# Patient Record
Sex: Male | Born: 1960 | Race: Black or African American | Hispanic: No | Marital: Married | Smoking: Former smoker
Health system: Southern US, Community
[De-identification: ages and names within clinical notes are randomized; demographics above are authoritative.]

## PROBLEM LIST (undated history)

## (undated) DIAGNOSIS — I10 Essential (primary) hypertension: Secondary | ICD-10-CM

## (undated) DIAGNOSIS — I509 Heart failure, unspecified: Secondary | ICD-10-CM

## (undated) DIAGNOSIS — D649 Anemia, unspecified: Secondary | ICD-10-CM

## (undated) DIAGNOSIS — K5730 Diverticulosis of large intestine without perforation or abscess without bleeding: Secondary | ICD-10-CM

## (undated) DIAGNOSIS — I2510 Atherosclerotic heart disease of native coronary artery without angina pectoris: Secondary | ICD-10-CM

## (undated) DIAGNOSIS — E785 Hyperlipidemia, unspecified: Secondary | ICD-10-CM

## (undated) HISTORY — PX: ANGIOPLASTY: SHX39

## (undated) HISTORY — DX: Hyperlipidemia, unspecified: E78.5

## (undated) HISTORY — PX: LIPOMA EXCISION: SHX5283

## (undated) HISTORY — DX: Diverticulosis of large intestine without perforation or abscess without bleeding: K57.30

## (undated) HISTORY — DX: Anemia, unspecified: D64.9

---

## 2000-09-04 ENCOUNTER — Emergency Department (HOSPITAL_COMMUNITY): Admission: EM | Admit: 2000-09-04 | Discharge: 2000-09-04 | Payer: Self-pay | Admitting: Emergency Medicine

## 2001-01-15 ENCOUNTER — Encounter: Payer: Self-pay | Admitting: Emergency Medicine

## 2001-01-15 ENCOUNTER — Emergency Department (HOSPITAL_COMMUNITY): Admission: EM | Admit: 2001-01-15 | Discharge: 2001-01-15 | Payer: Self-pay | Admitting: Emergency Medicine

## 2001-02-13 ENCOUNTER — Inpatient Hospital Stay (HOSPITAL_COMMUNITY): Admission: EM | Admit: 2001-02-13 | Discharge: 2001-02-18 | Payer: Self-pay | Admitting: Emergency Medicine

## 2001-02-13 ENCOUNTER — Encounter: Payer: Self-pay | Admitting: Emergency Medicine

## 2001-02-17 ENCOUNTER — Encounter: Payer: Self-pay | Admitting: Nephrology

## 2004-06-21 ENCOUNTER — Emergency Department (HOSPITAL_COMMUNITY): Admission: EM | Admit: 2004-06-21 | Discharge: 2004-06-22 | Payer: Self-pay | Admitting: Emergency Medicine

## 2010-03-22 ENCOUNTER — Encounter (INDEPENDENT_AMBULATORY_CARE_PROVIDER_SITE_OTHER): Payer: Self-pay | Admitting: *Deleted

## 2010-03-22 ENCOUNTER — Encounter: Admission: RE | Admit: 2010-03-22 | Discharge: 2010-03-22 | Payer: Self-pay | Admitting: Family Medicine

## 2010-05-02 ENCOUNTER — Encounter (INDEPENDENT_AMBULATORY_CARE_PROVIDER_SITE_OTHER): Payer: Self-pay | Admitting: *Deleted

## 2010-05-30 ENCOUNTER — Encounter (INDEPENDENT_AMBULATORY_CARE_PROVIDER_SITE_OTHER): Payer: Self-pay | Admitting: *Deleted

## 2010-05-30 ENCOUNTER — Ambulatory Visit: Payer: Self-pay | Admitting: Gastroenterology

## 2010-05-30 DIAGNOSIS — K5730 Diverticulosis of large intestine without perforation or abscess without bleeding: Secondary | ICD-10-CM | POA: Insufficient documentation

## 2010-06-14 ENCOUNTER — Ambulatory Visit: Payer: Self-pay | Admitting: Gastroenterology

## 2010-06-22 DIAGNOSIS — D509 Iron deficiency anemia, unspecified: Secondary | ICD-10-CM | POA: Insufficient documentation

## 2010-06-22 DIAGNOSIS — E785 Hyperlipidemia, unspecified: Secondary | ICD-10-CM | POA: Insufficient documentation

## 2010-06-22 DIAGNOSIS — I259 Chronic ischemic heart disease, unspecified: Secondary | ICD-10-CM | POA: Insufficient documentation

## 2010-06-22 DIAGNOSIS — I10 Essential (primary) hypertension: Secondary | ICD-10-CM | POA: Insufficient documentation

## 2010-06-26 ENCOUNTER — Ambulatory Visit: Payer: Self-pay | Admitting: Internal Medicine

## 2010-12-19 NOTE — Letter (Signed)
Summary: [facility] Instructions  [facility]  [address] [place], [place] [address]   Phone: [phone]  Fax: [phone]       Justin [name]    [birth year]    MRN: [id number]        Procedure Day /Date:06/14/10 WED     Arrival Time:3 pm     Procedure Time:4 pm     Location of Procedure:                    X  [facility] (4th Floor)   PREPARATION FOR COLONOSCOPY WITH MOVIPREP   Starting 5 days prior to your procedure 06/09/10 do not eat nuts, seeds, popcorn, corn, beans, peas,  salads, or any raw vegetables.  Do not take any fiber supplements (e.g. Metamucil, Citrucel, and Benefiber).  THE DAY BEFORE YOUR PROCEDURE         DATE: 06/13/10 DAY: TUE  1.  Drink clear liquids the entire day-NO SOLID FOOD  2.  Do not drink anything colored red or purple.  Avoid juices with pulp.  No orange juice.  3.  Drink at least 64 oz. (8 glasses) of fluid/clear liquids during the day to prevent dehydration and help the prep work efficiently.  CLEAR LIQUIDS INCLUDE: Water Jello Ice Popsicles Tea (sugar ok, no milk/cream) Powdered fruit flavored drinks Coffee (sugar ok, no milk/cream) Gatorade Juice: apple, white grape, white cranberry  Lemonade Clear bullion, consomm, broth Carbonated beverages (any kind) Strained chicken noodle soup Hard Candy                             4.  In the morning, mix first dose of MoviPrep solution:    Empty 1 Pouch A and 1 Pouch B into the disposable container    Add lukewarm drinking water to the top line of the container. Mix to dissolve    Refrigerate (mixed solution should be used within 24 hrs)  5.  Begin drinking the prep at 5:00 p.m. The MoviPrep container is divided by 4 marks.   Every 15 minutes drink the solution down to the next mark (approximately 8 oz) until the full liter is complete.   6.  Follow completed prep with 16 oz of clear liquid of your choice (Nothing red or purple).  Continue to drink clear  liquids until bedtime.  7.  Before going to bed, mix second dose of MoviPrep solution:    Empty 1 Pouch A and 1 Pouch B into the disposable container    Add lukewarm drinking water to the top line of the container. Mix to dissolve    Refrigerate  THE DAY OF YOUR PROCEDURE      DATE: 06/14/10 DAY:WED  Beginning at 11 a.m. (5 hours before procedure):         1. Every 15 minutes, drink the solution down to the next mark (approx 8 oz) until the full liter is complete.  2. Follow completed prep with 16 oz. of clear liquid of your choice.    3. You may drink clear liquids until 2 pm (2 HOURS BEFORE PROCEDURE).   MEDICATION INSTRUCTIONS  Unless otherwise instructed, you should take regular prescription medications with a small sip of water   as early as possible the morning of your procedure.        OTHER INSTRUCTIONS  You will need a responsible adult at least 52 years of age to accompany you  and drive you home.   This person must remain in the waiting room during your procedure.  Wear loose fitting clothing that is easily removed.  Leave jewelry and other valuables at home.  However, you may wish to bring a book to read or  an iPod/MP3 player to listen to music as you wait for your procedure to start.  Remove all body piercing jewelry and leave at home.  Total time from sign-in until discharge is approximately 2-3 hours.  You should go home directly after your procedure and rest.  You can resume normal activities the  day after your procedure.  The day of your procedure you should not:   Drive   Make legal decisions   Operate machinery   Drink alcohol   Return to work  You will receive specific instructions about eating, activities and medications before you leave.    The above instructions have been reviewed and explained to me by   _______________________    I fully understand and can verbalize these instructions _____________________________ Date  _________

## 2010-12-19 NOTE — Assessment & Plan Note (Signed)
History of Present Illness Visit Type: Initial Consult Primary GI MD: Rob Bunting MD Primary Provider: Meredith Staggers, MD Requesting Provider: Meredith Staggers, MD Chief Complaint: Consult before colonoscopy, diverticulitis History of Present Illness:     very pleasant 50 year old man who had left sided abd pains, crunching sensation about 2 months ago..  No fevers or chills.  Went  to urgent clinic.  Had CT, which showed a left-sided acute diverticulitis  was put on cipro/flagyl  for about 1 week.   His symptoms improved quite quickly.   He had never had diverticulitis previously.  Never had colonoscopy.           Current Medications (verified): 1)  Lisinopril-Hydrochlorothiazide 10-12.5 Mg Tabs (Lisinopril-Hydrochlorothiazide) .Marland Kitchen.. 1 By Mouth Once Daily  Allergies (verified): No Known Drug Allergies  Past History:  Past Medical History: Diverticulitis left colon 2011 Hypertension Anemia Hyperlipidemia CAD s/p angioplasty in 2003.  Past Surgical History: none gynecomastia  Family History: prostate cancer  Review of Systems       Pertinent positive and negative review of systems were noted in the above HPI and GI specific review of systems.  All other review of systems was otherwise negative.   Vital Signs:  Patient profile:   50 year old male Height:      66 inches Weight:      224.8 pounds BMI:     36.41 Pulse rate:   84 / minute Pulse rhythm:   regular BP sitting:   210 / 120  (left arm) Cuff size:   regular  Vitals Entered By: Harlow Mares CMA Duncan Dull) (May 30, 2010 2:23 PM)  Physical Exam  Additional Exam:  Constitutional: generally well appearing Psychiatric: alert and oriented times 3 Eyes: extraocular movements intact Mouth: oropharynx moist, no lesions Neck: supple, no lymphadenopathy Cardiovascular: heart regular rate and rythm Lungs: CTA bilaterally Abdomen: soft, non-tender, non-distended, no obvious ascites, no peritoneal signs,  normal bowel sounds Extremities: no lower extremity edema bilaterally Skin: no lesions on visible extremities    Impression & Recommendations:  Problem # 1:  Recent diverticulitis his symptoms have resolved on oral antibiotics. He has never had a colonoscopy and we will set that up at his soonest convenience.  Problem # 2:  hypertension his blood pressure was quite high today. He did not have any headaches or eye symptoms. I suspect he is been running this high for quite some time. He recently had blood pressure meds prescribed by an urgent clinic one to 2 months ago and he will stop by a day or later today to ask about adjusting his dose. More importantly we will work to help him get set up with a new primary care physician  Patient Instructions: 1)  We will help set you up with a new primary care doctor through [facility]. (Dr. Felicity Coyer) 2)  Go to urgent clinic today for BP medicine adjustment. 3)  Keep trying to stop smoking. 4)  You will be scheduled to have a colonoscopy. 5)  The medication list was reviewed and reconciled.  All changed / newly prescribed medications were explained.  A complete medication list was provided to the patient / caregiver.  Appended Document: Orders Update/Colon    Clinical Lists Changes  Problems: Added new problem of DIVERTICULOSIS OF COLON (ICD-562.10) Medications: Added new medication of MOVIPREP 100 GM  SOLR (PEG-KCL-NACL-NASULF-NA ASC-C) As per prep instructions. - Signed Rx of MOVIPREP 100 GM  SOLR (PEG-KCL-NACL-NASULF-NA ASC-C) As per prep instructions.;  #1 x 0;  Signed;  Entered by: Chales Abrahams CMA (AAMA);  Authorized by: Rachael Fee MD;  Method used: Electronically to [facility]. [phone]*, [address] [place], [place], [place]  [address], Ph: [phone], Fax: [phone] Orders: Added new Test order of Colonoscopy (Colon) - Signed    Prescriptions: MOVIPREP 100 GM  SOLR (PEG-KCL-NACL-NASULF-NA ASC-C) As per prep  instructions.  #1 x 0   Entered by:   Chales Abrahams CMA (AAMA)   Authorized by:   Rachael Fee MD   Signed by:   Chales Abrahams CMA (AAMA) on 05/30/2010   Method used:   Electronically to        [facility]. [phone]* (retail)       [address]       [place], [place]  [address]       Ph: [phone]       Fax: [phone]   RxID:   [id number]

## 2010-12-19 NOTE — Procedures (Signed)
Summary: Colonoscopy  Patient: Justin [name] Note: All result statuses are Final unless otherwise noted.  Tests: (1) Colonoscopy (COL)   COL Colonoscopy           DONE     [facility]     [address]     [place], [place]  [address]           COLONOSCOPY PROCEDURE REPORT           PATIENT:  Justin, [name]  MR#:  [id number]     BIRTHDATE:  04-Apr-1961, 48 yrs. old  GENDER:  male     ENDOSCOPIST:  Rachael Fee, MD     REFERRED:  Karyl Kinnier, MD     PROCEDURE DATE:  06/14/2010     PROCEDURE:  Colonoscopy with snare polypectomy     ASA CLASS:  Class II     INDICATIONS:  recent diverticulitis, never had colonoscopy     MEDICATIONS:   Fentanyl 100 mcg IV, Versed 10 mg IV           DESCRIPTION OF PROCEDURE:   After the risks benefits and     alternatives of the procedure were thoroughly explained, informed     consent was obtained.  Digital rectal exam was performed and     revealed no rectal masses.   The [id number] endoscope was     introduced through the anus and advanced to the cecum, which was     identified by both the appendix and ileocecal valve, without     limitations.  The quality of the prep was good, using MoviPrep.     The instrument was then slowly withdrawn as the colon was fully     examined.     <<PROCEDUREIMAGES>>     FINDINGS:  Moderate diverticulosis was found in the sigmoid to     descending colon segments. The mucosa in this region was     edematous, tortuous, thickened (see image9 and image8).  A     diminutive polyp was found in the ascending colon. This was 1-89mm     across, sessile, removed with cold snare (see image5).  This was     not retrieved.  This was otherwise a normal examination of the     colon (see image4, image3, and image10).   Retroflexed views in     the rectum revealed no abnormalities.    The scope was then     withdrawn from the patient and the procedure completed.           COMPLICATIONS:  None        ENDOSCOPIC IMPRESSION:     1) Moderate diverticulosis in the sigmoid to descending colon     segments     2) Diminutive polyp in the ascending colon, removed.     3) Otherwise normal examination           RECOMMENDATIONS:     1) Continue current colorectal screening recommendations for     "routine risk" patients with a repeat colonoscopy in 10 years.     2) Call Dr. Christella Hartigan' office if you have symptoms of     diverticulitis again.           ______________________________     Rachael Fee, MD           n.     eSIGNED:   Rachael Fee at 06/14/2010 04:23 PM  Justin, [name], [id number]  Note: An exclamation mark (!) indicates a result that was not dispersed into the flowsheet. Document Creation Date: 06/14/2010 4:23 PM _______________________________________________________________________  (1) Order result status: Final Collection or observation date-time: 06/14/2010 16:18 Requested date-time:  Receipt date-time:  Reported date-time:  Referring Physician:   Ordering Physician: Rob Bunting [id number]) Specimen Source:  Source: Launa Grill Order Number: [id number] Lab site:

## 2010-12-19 NOTE — Letter (Signed)
Summary: New Patient letter  [facility]  [address] [place], [place] [address]   Phone: [phone]  Fax: [phone]       05/02/2010 MRN: [id number]  [facility] [address] [place], [place]  [address]  Dear Justin [name],  Welcome to the Gastroenterology Division at [facility].    You are scheduled to see Dr.  Rob Bunting on May 30, 2010 at 2:30pm on the 3rd floor at [facility], [address]  We ask that you try to arrive at our office 15 minutes prior to your appointment time to allow for check-in.  We would like you to complete the enclosed self-administered evaluation form prior to your visit and bring it with you on the day of your appointment.  We will review it with you.  Also, please bring a complete list of all your medications or, if you prefer, bring the medication bottles and we will list them.  Please bring your insurance card so that we may make a copy of it.  If your insurance requires a referral to see a specialist, please bring your referral form from your primary care physician.  Co-payments are due at the time of your visit and may be paid by cash, check or credit card.     Your office visit will consist of a consult with your physician (includes a physical exam), any laboratory testing he/she may order, scheduling of any necessary diagnostic testing (e.g. x-ray, ultrasound, CT-scan), and scheduling of a procedure (e.g. Endoscopy, Colonoscopy) if required.  Please allow enough time on your schedule to allow for any/all of these possibilities.    If you cannot keep your appointment, please call [phone] to cancel or reschedule prior to your appointment date.  This allows [place] the opportunity to schedule an appointment for another patient in need of care.  If you do not cancel or reschedule by 5 p.m. the business day prior to your appointment date, you will be charged a $50.00 late cancellation/no-show fee.    Thank you for  choosing  Gastroenterology for your medical needs.  We appreciate the opportunity to care for you.  Please visit [place] at our website  to learn more about our practice.                     Sincerely,                                                             The Gastroenterology Division
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
## 2012-12-09 ENCOUNTER — Emergency Department (HOSPITAL_BASED_OUTPATIENT_CLINIC_OR_DEPARTMENT_OTHER): Payer: BC Managed Care – PPO

## 2012-12-09 ENCOUNTER — Emergency Department (HOSPITAL_BASED_OUTPATIENT_CLINIC_OR_DEPARTMENT_OTHER)
Admission: EM | Admit: 2012-12-09 | Discharge: 2012-12-09 | Disposition: A | Discharge status: Home / Self Care | Payer: BC Managed Care – PPO | Attending: Emergency Medicine | Admitting: Emergency Medicine

## 2012-12-09 ENCOUNTER — Encounter (HOSPITAL_BASED_OUTPATIENT_CLINIC_OR_DEPARTMENT_OTHER): Payer: Self-pay | Admitting: *Deleted

## 2012-12-09 DIAGNOSIS — I2510 Atherosclerotic heart disease of native coronary artery without angina pectoris: Secondary | ICD-10-CM | POA: Insufficient documentation

## 2012-12-09 DIAGNOSIS — Z9119 Patient's noncompliance with other medical treatment and regimen: Secondary | ICD-10-CM | POA: Insufficient documentation

## 2012-12-09 DIAGNOSIS — F17200 Nicotine dependence, unspecified, uncomplicated: Secondary | ICD-10-CM | POA: Insufficient documentation

## 2012-12-09 DIAGNOSIS — E876 Hypokalemia: Secondary | ICD-10-CM | POA: Insufficient documentation

## 2012-12-09 DIAGNOSIS — Z9114 Patient's other noncompliance with medication regimen: Secondary | ICD-10-CM

## 2012-12-09 DIAGNOSIS — Z91199 Patient's noncompliance with other medical treatment and regimen due to unspecified reason: Secondary | ICD-10-CM | POA: Insufficient documentation

## 2012-12-09 DIAGNOSIS — I10 Essential (primary) hypertension: Secondary | ICD-10-CM | POA: Insufficient documentation

## 2012-12-09 DIAGNOSIS — Z79899 Other long term (current) drug therapy: Secondary | ICD-10-CM | POA: Insufficient documentation

## 2012-12-09 HISTORY — DX: Atherosclerotic heart disease of native coronary artery without angina pectoris: I25.10

## 2012-12-09 HISTORY — DX: Essential (primary) hypertension: I10

## 2012-12-09 LAB — BASIC METABOLIC PANEL
Chloride: 98 mEq/L (ref 96–112)
Creatinine, Ser: 1.1 mg/dL (ref 0.50–1.35)
GFR calc non Af Amer: 75 mL/min — ABNORMAL LOW (ref >90)
Glucose, Bld: 89 mg/dL (ref 70–99)
Sodium: 138 mEq/L (ref 135–145)

## 2012-12-09 LAB — CBC WITH DIFFERENTIAL/PLATELET
Eosinophils Relative: 2 % (ref 0–5)
HCT: 41.7 % (ref 39.0–52.0)
Hemoglobin: 14.7 g/dL (ref 13.0–17.0)
Lymphocytes Relative: 29 % (ref 12–46)
Lymphs Abs: 1.7 10*3/uL (ref 0.7–4.0)
MCH: 31.4 pg (ref 26.0–34.0)
MCV: 89.1 fL (ref 78.0–100.0)

## 2012-12-09 MED ORDER — ZOLPIDEM TARTRATE 5 MG PO TABS: 5.0000 mg | ORAL_TABLET | Freq: Every evening | ORAL | Status: DC | PRN

## 2012-12-09 MED ORDER — CLONIDINE HCL 0.1 MG PO TABS: 0.2000 mg | ORAL_TABLET | Freq: Once | ORAL | Status: DC

## 2012-12-09 MED ORDER — LISINOPRIL 20 MG PO TABS: 10.0000 mg | ORAL_TABLET | Freq: Every day | ORAL | Status: DC

## 2012-12-09 MED ORDER — CLONIDINE HCL 0.1 MG PO TABS
ORAL_TABLET | ORAL | Status: AC
  Filled 2012-12-09: qty 1

## 2012-12-09 MED ORDER — SODIUM CHLORIDE 0.9 % IV SOLN: Freq: Once | INTRAVENOUS | Status: DC

## 2012-12-09 MED ORDER — CLONIDINE HCL 0.1 MG PO TABS
0.1000 mg | ORAL_TABLET | Freq: Once | ORAL | Status: AC
  Administered 2012-12-09: 0.1 mg via ORAL
  Filled 2012-12-09: qty 1

## 2012-12-09 MED ORDER — CLONIDINE HCL 0.1 MG PO TABS
0.1000 mg | ORAL_TABLET | Freq: Once | ORAL | Status: AC
  Administered 2012-12-09: 0.1 mg via ORAL

## 2012-12-09 MED ORDER — CLONIDINE HCL 0.1 MG PO TABS
0.2000 mg | ORAL_TABLET | Freq: Once | ORAL | Status: AC
  Administered 2012-12-09: 0.2 mg via ORAL
  Filled 2012-12-09: qty 2

## 2012-12-09 NOTE — ED Notes (Signed)
MD at bedside. 

## 2012-12-09 NOTE — ED Notes (Addendum)
Chest tightness on and off since his mothers death a week ago. No pain at this time. He took ASA before coming here. He had an episode of pressure in his chest at work when co-workers were giving their condolences. Admits to not taking his BP medication for a few days due to stress of the funeral.

## 2012-12-09 NOTE — ED Provider Notes (Signed)
History     CSN: [id number]  Arrival date & time 12/09/12  1754   First MD Initiated Contact with Patient 12/09/12 1807      Chief Complaint  Patient presents with  . Chest Pain     HPI Chest tightness on and off since his mothers death a week ago. No pain at this time. He took ASA before coming here. He had an episode of pressure in his chest at work when co-workers were giving their condolences. Admits to not taking his BP medication for a few days due to stress of the funeral. Patient denies chest pain or chest pressure at the present time Past Medical History  Diagnosis Date  . Hypertension   . Coronary artery disease     Past Surgical History  Procedure Date  . Angioplasty     No family history on file.  History  Substance Use Topics  . Smoking status: Current Every Day Smoker -- 0.5 packs/day    Types: Cigarettes  . Smokeless tobacco: Not on file  . Alcohol Use: Yes      Review of Systems All other systems reviewed and are negative Allergies  Review of patient's allergies indicates no known allergies.  Home Medications   Current Outpatient Rx  Name  Route  Sig  Dispense  Refill  . LISINOPRIL PO   Oral   Take by mouth.         . CRESTOR PO   Oral   Take by mouth.         Marland Kitchen LISINOPRIL 20 MG PO TABS   Oral   Take 0.5 tablets (10 mg total) by mouth daily.   30 tablet   0   . ZOLPIDEM TARTRATE 5 MG PO TABS   Oral   Take 1 tablet (5 mg total) by mouth at bedtime as needed for sleep.   15 tablet   0     BP 192/98  Pulse 69  Temp 97.9 F (36.6 C) (Oral)  Resp 11  SpO2 99%  Physical Exam  Nursing note and vitals reviewed. Constitutional: He is oriented to person, place, and time. He appears well-developed and well-nourished. No distress.  HENT:  Head: Normocephalic and atraumatic.  Eyes: Pupils are equal, round, and reactive to light.  Neck: Normal range of motion.  Cardiovascular: Normal rate and intact distal pulses.  Exam  reveals no friction rub.   No murmur heard. Pulmonary/Chest: No respiratory distress.  Abdominal: Normal appearance. He exhibits no distension.  Musculoskeletal: Normal range of motion.  Neurological: He is alert and oriented to person, place, and time. No cranial nerve deficit.  Skin: Skin is warm and dry. No rash noted.  Psychiatric: He has a normal mood and affect. His behavior is normal.    ED Course  Procedures (including critical care time)  Date: 12/09/2012  Rate: 81  Rhythm: normal sinus rhythm  QRS Axis: normal  Intervals: normal  ST/T Wave abnormalities: Nonspecific ST changes  Conduction Disutrbances: none  Narrative Interpretation: Abnormal EKG no significant change from tracing of 2002    Medications  LISINOPRIL PO (not administered)  Rosuvastatin Calcium (CRESTOR PO) (not administered)  0.9 %  sodium chloride infusion (0  Intravenous Stopped 12/09/12 2023)  lisinopril (PRINIVIL,ZESTRIL) 20 MG tablet (not administered)  zolpidem (AMBIEN) 5 MG tablet (not administered)  cloNIDine (CATAPRES) tablet 0.2 mg (0.2 mg Oral Given 12/09/12 1814)  cloNIDine (CATAPRES) tablet 0.1 mg (0.1 mg Oral Given 12/09/12 1855)  cloNIDine (CATAPRES) tablet  0.1 mg (0.1 mg Oral Given 12/09/12 1932)      Labs Reviewed  BASIC METABOLIC PANEL - Abnormal; Notable for the following:    Potassium 3.0 (*)     GFR calc non Af Amer 75 (*)     GFR calc Af Amer 87 (*)     All other components within normal limits  CBC WITH DIFFERENTIAL  TROPONIN I   Dg Chest 2 View  12/09/2012  *RADIOLOGY REPORT*  Clinical Data: Chest pain.  CHEST - 2 VIEW  Comparison: No priors.  Findings: Lung volumes are normal.  No consolidative airspace disease.  No pleural effusions.  No pneumothorax.  No pulmonary nodule or mass noted.  Pulmonary vasculature and the cardiomediastinal silhouette are within normal limits.  IMPRESSION: 1. No radiographic evidence of acute cardiopulmonary disease.   Original Report  Authenticated By: Trudie Reed, M.D.      1. HYPERTENSION   2. Noncompliance with medication regimen   3. Hypokalemia       MDM         Nelia Shi, MD 12/09/12 [phone]

## 2012-12-09 NOTE — ED Notes (Signed)
Pt returned from radiology.

## 2012-12-09 NOTE — ED Notes (Signed)
Pt medicated for blood pressure as ordered.  Pt remains chest pain free at present.

## 2012-12-09 NOTE — ED Notes (Signed)
Patient transported to X-ray 

## 2012-12-09 NOTE — ED Notes (Signed)
Pt had 2 81mg  Aspirin PTA

## 2012-12-15 ENCOUNTER — Encounter (HOSPITAL_BASED_OUTPATIENT_CLINIC_OR_DEPARTMENT_OTHER): Payer: Self-pay | Admitting: *Deleted

## 2012-12-15 ENCOUNTER — Ambulatory Visit (INDEPENDENT_AMBULATORY_CARE_PROVIDER_SITE_OTHER): Payer: BC Managed Care – PPO | Admitting: Family

## 2012-12-15 ENCOUNTER — Emergency Department (HOSPITAL_BASED_OUTPATIENT_CLINIC_OR_DEPARTMENT_OTHER)
Admission: EM | Admit: 2012-12-15 | Discharge: 2012-12-15 | Disposition: A | Discharge status: Home / Self Care | Payer: BC Managed Care – PPO | Attending: Emergency Medicine | Admitting: Emergency Medicine

## 2012-12-15 ENCOUNTER — Encounter: Payer: Self-pay | Admitting: Family

## 2012-12-15 VITALS — BP 224/126 | HR 72 | Temp 98.2°F | Resp 16 | Ht 71.5 in | Wt 238.0 lb

## 2012-12-15 DIAGNOSIS — Z79899 Other long term (current) drug therapy: Secondary | ICD-10-CM | POA: Insufficient documentation

## 2012-12-15 DIAGNOSIS — E876 Hypokalemia: Secondary | ICD-10-CM

## 2012-12-15 DIAGNOSIS — E785 Hyperlipidemia, unspecified: Secondary | ICD-10-CM

## 2012-12-15 DIAGNOSIS — Z7982 Long term (current) use of aspirin: Secondary | ICD-10-CM | POA: Insufficient documentation

## 2012-12-15 DIAGNOSIS — I2510 Atherosclerotic heart disease of native coronary artery without angina pectoris: Secondary | ICD-10-CM | POA: Insufficient documentation

## 2012-12-15 DIAGNOSIS — I10 Essential (primary) hypertension: Secondary | ICD-10-CM

## 2012-12-15 DIAGNOSIS — I259 Chronic ischemic heart disease, unspecified: Secondary | ICD-10-CM

## 2012-12-15 DIAGNOSIS — Z87891 Personal history of nicotine dependence: Secondary | ICD-10-CM | POA: Insufficient documentation

## 2012-12-15 LAB — BASIC METABOLIC PANEL
BUN: 15 mg/dL (ref 6–23)
Calcium: 9.3 mg/dL (ref 8.4–10.5)
Creatinine, Ser: 1.2 mg/dL (ref 0.50–1.35)
GFR calc Af Amer: 78 mL/min — ABNORMAL LOW (ref >90)
GFR calc non Af Amer: 67 mL/min — ABNORMAL LOW (ref >90)
Potassium: 3.2 mEq/L — ABNORMAL LOW (ref 3.5–5.1)

## 2012-12-15 LAB — CBC
HCT: 38 % — ABNORMAL LOW (ref 39.0–52.0)
MCHC: 34.5 g/dL (ref 30.0–36.0)
RDW: 11.5 % (ref 11.5–15.5)

## 2012-12-15 MED ORDER — METOPROLOL SUCCINATE ER 50 MG PO TB24
50.0000 mg | ORAL_TABLET | Freq: Every day | ORAL | Status: DC
  Filled 2012-12-15: qty 1

## 2012-12-15 MED ORDER — METOPROLOL TARTRATE 50 MG PO TABS
ORAL_TABLET | ORAL | Status: AC
  Filled 2012-12-15: qty 1

## 2012-12-15 MED ORDER — ASPIRIN EC 81 MG PO TBEC: 81.0000 mg | DELAYED_RELEASE_TABLET | Freq: Every day | ORAL | Status: AC

## 2012-12-15 MED ORDER — METOPROLOL TARTRATE 50 MG PO TABS: 50.0000 mg | ORAL_TABLET | Freq: Once | ORAL | Status: DC

## 2012-12-15 MED ORDER — METOPROLOL SUCCINATE ER 50 MG PO TB24: 50.0000 mg | ORAL_TABLET | Freq: Every day | ORAL | Status: DC

## 2012-12-15 NOTE — Assessment & Plan Note (Signed)
Deteriorated- pt was given a total of 0.2mg  of clonidine today in the office.  30 minutes after 2nd dose, BP was 224/112.  I advised pt to go downstairs to the ED for further evaluation.  Report was called to Charge RN Vicky at the [facility] ED.  Will also add beta blocker to his regimen.

## 2012-12-15 NOTE — Patient Instructions (Addendum)
Please go directly to the emergency room.  You will be contacted about your referral to cardiology.  Please let us know if you have not heard back within 1 week about your referral. Please follow up in 1 week. Welcome to [facility]!

## 2012-12-15 NOTE — ED Provider Notes (Signed)
History     CSN: [id number]  Arrival date & time 12/15/12  1137   First MD Initiated Contact with Patient 12/15/12 1224      Chief Complaint  Patient presents with  . Hypertension    (Consider location/radiation/quality/duration/timing/severity/associated sxs/prior treatment) HPI Comments: Patient was diagnosed with hypertension one week ago. He was started on lisinopril last week. At that time he had been complaining of fatigue and some headaches. Since beginning no cerebral, he states he's noted some strong improvement. He states he is feeling great lately. He stopped smoking and is working out again due to the recent death of his mother one week ago.  Patient was going for a followup visit with his primary care Dr., Dr. Lendell Caprice, who noted his blood pressure was 215/114. She gave him 0.2 mg of clonidine and did not note any improvement in his blood pressure. On arrival here in the emergency department, his blood pressure has decreased to 190/103. He denies any symptoms at this point. He was given a prescription for metoprolol 50 mg daily at the visit today to at on to his lisinopril. He did not get that today.   Patient is a 52 y.o. male presenting with hypertension. The history is provided by the patient.  Hypertension This is a recurrent problem. The current episode started 1 to 4 weeks ago. The problem occurs constantly. The problem has been gradually worsening. Associated symptoms include headaches (intermittent, none today). Pertinent negatives include no abdominal pain, coughing, fever, rash, vertigo, visual change or vomiting. Nothing aggravates the symptoms. He has tried nothing for the symptoms.    Past Medical History  Diagnosis Date  . Hypertension   . Coronary artery disease   . Hyperlipidemia     Past Surgical History  Procedure Date  . Angioplasty   . Lipoma excision     9th grade    Family History  Problem Relation Age of Onset  . Diabetes Mother   . Heart  disease Mother   . Cancer Father     colon  . Hypertension Maternal Uncle   . Heart disease Paternal Uncle   . Diabetes Maternal Grandmother   . Hyperlipidemia Neg Hx     History  Substance Use Topics  . Smoking status: Former Smoker -- 0.5 packs/day    Types: Cigarettes    Quit date: 12/09/2012  . Smokeless tobacco: Not on file  . Alcohol Use: Yes      Review of Systems  Constitutional: Negative for fever.  Respiratory: Negative for cough.   Gastrointestinal: Negative for vomiting and abdominal pain.  Skin: Negative for rash.  Neurological: Positive for headaches (intermittent, none today). Negative for vertigo.  All other systems reviewed and are negative.    Allergies  Review of patient's allergies indicates no known allergies.  Home Medications   Current Outpatient Rx  Name  Route  Sig  Dispense  Refill  . ASPIRIN EC 81 MG PO TBEC   Oral   Take 1 tablet (81 mg total) by mouth daily.         Marland Kitchen LISINOPRIL 20 MG PO TABS   Oral   Take 20 mg by mouth daily.         Marland Kitchen METOPROLOL SUCCINATE ER 50 MG PO TB24   Oral   Take 1 tablet (50 mg total) by mouth daily. Take with or immediately following a meal.   30 tablet   0   . CRESTOR PO   Oral  Take by mouth.         . ZOLPIDEM TARTRATE 5 MG PO TABS   Oral   Take 1 tablet (5 mg total) by mouth at bedtime as needed for sleep.   15 tablet   0     BP 190/103  Pulse 68  Temp 97.8 F (36.6 C) (Oral)  Resp 18  SpO2 99%  Physical Exam  Nursing note and vitals reviewed. Constitutional: He is oriented to person, place, and time. He appears well-developed and well-nourished. No distress.  HENT:  Head: Normocephalic and atraumatic.  Mouth/Throat: No oropharyngeal exudate.  Eyes: EOM are normal. Pupils are equal, round, and reactive to light.  Neck: Normal range of motion. Neck supple.  Cardiovascular: Normal rate and regular rhythm.  Exam reveals no friction rub.   No murmur heard. Pulmonary/Chest:  Effort normal and breath sounds normal. No respiratory distress. He has no wheezes. He has no rales.  Abdominal: He exhibits no distension. There is no tenderness. There is no rebound.  Musculoskeletal: Normal range of motion. He exhibits no edema.  Neurological: He is alert and oriented to person, place, and time. No cranial nerve deficit. He exhibits normal muscle tone. Coordination and gait normal.  Skin: He is not diaphoretic.    ED Course  Procedures (including critical care time)  Labs Reviewed  CBC - Abnormal; Notable for the following:    RBC 4.19 (*)     HCT 38.0 (*)     All other components within normal limits  BASIC METABOLIC PANEL - Abnormal; Notable for the following:    Potassium 3.2 (*)     Glucose, Bld 106 (*)     GFR calc non Af Amer 67 (*)     GFR calc Af Amer 78 (*)     All other components within normal limits  TROPONIN I  TROPONIN I   No results found.   1. Hypertension      Date: 12/15/2012  Rate: 51  Rhythm: sinus bradycardia  QRS Axis: normal  Intervals: PR prolonged  ST/T Wave abnormalities: ST depressions inferiorly and ST depressions laterally  Conduction Disutrbances:first-degree A-V block   Narrative Interpretation:   Old EKG Reviewed: changes noted and new inversions in inferior leads and lateral leads compared to last week.    MDM   Patient is a 52 year old male with history of hypertension. Currently on the Cipro has been feeling well since initiating treatment. In followup today he had higher blood pressures than previous. After some clonidine at the primary care's office, the pressure did not come down. In the emergency department, his pressure is improving at 190/103. Patient here denies any chest pain, shortness of breath, dizziness, headache, neurologic symptoms, burning vision. His physical exam is normal. He was supposed to start metoprolol 50 mg extended release tablets today. I will give him that now. Also check an EKG. He does  not show any signs of hypertensive urgency or emergency. His labs checked last week when he was initially diagnosed with hypertension were normal. I do not feel patient warrants any IV therapy for his high blood pressure. I believe this is likely due to stress since it is also his mother's birthday, and she passed away last week. EKG shows some marked changes from last week. Patient has inverted T-waves in inferior and lateral leads. Will check troponin and other basic labs. Initial troponin normal. I spoke with Dr. Jens Som from [facility] cardiology he stated that this is likely due to LVH.  He does not need urgent cardiology followup. I spoke with patient about possible admission for hypertensive urgency as he had EKG changes I felt were related to his hypertension, and he does not want to be admitted. His blood pressure trended down last one I saw 179/103, although it is not documented. His second troponin was normal also. He has followup with his primary care doctor next week. Patient is stable we instructed to continue home blood pressure checks and return if any problems arise. Patient is a easily ambulatory out of the ED upon discharge.   Elwin Mocha, MD 12/16/12 [phone]

## 2012-12-15 NOTE — ED Notes (Signed)
Pt amb to room 10 with quick steady gait smiling and laughing in nad. Pt reports recently dx with hypertension and rx meds on week ago, seen for follow up today with bp at 200's over 100's. Sandford Craze NP gave 0.2 of clonidine, waited 45 minutes and bp did not come down. Pt states he mother passed away last week, and he has been stressed.  Pt denies ha or any other c/o.

## 2012-12-15 NOTE — Assessment & Plan Note (Signed)
Add beta blocker.  Refer to cardiology for ongoing management and care. Continue aspirin daily.

## 2012-12-15 NOTE — Assessment & Plan Note (Signed)
Will likely need to be on statin.  He will come fasting next visit so we can evaluate his cholesterol and dose crestor appropriately.

## 2012-12-15 NOTE — Progress Notes (Signed)
Subjective:    Patient ID: Justin [name], male    DOB: 1961-04-07, 52 y.o.   MRN: [id number]  HPI  [name] is a 52 yr old male who presents today to establish care. He has not been following with a primary care for the last year or so. He did have a visit to the ED 1/21 due to chest pain. Work up was negative and he was given rx for lisinopril 20mg - 1/2 tablet by mouth once daily which he tells me that he has been taking.   CAD- Pt reports angioplasty 2003.  Reports that this was done at [facility].  He does not follow with cardiology. Denies current chest pain.    HTN- He reports being diagnosed around age 89.  Currently on lisinopril 10mg .   Hyperlipidemia- Has tolerated crestor in the past. Has been out for a while.  Review of Systems  Constitutional: Negative for unexpected weight change.  HENT: Negative for hearing loss and congestion.   Eyes: Negative for visual disturbance.  Respiratory: Negative for cough.   Cardiovascular: Negative for chest pain, palpitations and leg swelling.  Gastrointestinal: Negative for nausea, vomiting and diarrhea.  Genitourinary: Negative for dysuria and frequency.  Musculoskeletal: Negative for arthralgias.  Skin: Negative for rash.  Neurological: Negative for headaches.  Hematological: Negative for adenopathy.  Psychiatric/Behavioral:       Denies depression/anxiety    Past Medical History  Diagnosis Date  . Hypertension   . Coronary artery disease   . Hyperlipidemia     History   Social History  . Marital Status: Married    Spouse Name: N/A    Number of Children: N/A  . Years of Education: N/A   Occupational History  . Not on file.   Social History Main Topics  . Smoking status: Former Smoker -- 0.5 packs/day    Types: Cigarettes    Quit date: 12/09/2012  . Smokeless tobacco: Not on file  . Alcohol Use: Yes  . Drug Use: No  . Sexually Active: Not on file   Other Topics Concern  . 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  Enjoys television and music, computer.    Past Surgical History  Procedure Date  . Angioplasty   . Lipoma excision     9th grade    Family History  Problem Relation Age of Onset  . Diabetes Mother   . Heart disease Mother   . Cancer Father     colon  . Hypertension Maternal Uncle   . Heart disease Paternal Uncle   . Diabetes Maternal Grandmother   . Hyperlipidemia Neg Hx     No Known Allergies  Current Outpatient Prescriptions on File Prior to Visit  Medication Sig Dispense Refill  . lisinopril (PRINIVIL,ZESTRIL) 20 MG tablet Take 20 mg by mouth daily.      Marland Kitchen zolpidem (AMBIEN) 5 MG tablet Take 1 tablet (5 mg total) by mouth at bedtime as needed for sleep.  15 tablet  0  . metoprolol succinate (TOPROL-XL) 50 MG 24 hr tablet Take 1 tablet (50 mg total) by mouth daily. Take with or immediately following a meal.  30 tablet  0  . Rosuvastatin Calcium (CRESTOR PO) Take by mouth.        BP 224/126  Pulse 72  Temp 98.2 F (36.8 C) (Oral)  Resp 16  Ht 5' 11.5" (1.816 m)  Wt 238 lb 0.6 oz (107.974 kg)  BMI 32.74 kg/m2  SpO2 99%       Objective:   Physical Exam  Constitutional: He appears well-developed and well-nourished. No distress.  HENT:  Head: Normocephalic and atraumatic.  Eyes: No scleral icterus.  Cardiovascular: Normal rate and regular rhythm.   No murmur heard. Pulmonary/Chest: Effort normal and breath sounds normal. No respiratory distress. He has no wheezes. He has no rales. He exhibits no tenderness.  Musculoskeletal: He exhibits no edema.  Lymphadenopathy:    He has no cervical adenopathy.  Skin: Skin is warm and dry.  Psychiatric: He has a normal mood and affect. His behavior is normal. Judgment and thought content normal.          Assessment & Plan:

## 2012-12-16 NOTE — ED Provider Notes (Signed)
52 y.o. Male sent from pmd office with sbp of 210.  Pateitn seen here one week ago with significantly elevated bp.  He has no other complaints.  He was given clonidine in the private physician's office and his blood pressure has decreased without signs of hypotension.  His ekg shows signs of lvh but previous ekg did not.  These changes were discussed with Dr. Jens Som and he reviewed the ekg from today and a week ago.  Troponin is negative times 2 and patient asymptomatic.  Patient advised to return if any s/s of cardiac abnormalities and close follow up arranged but no specific appointment given.  I performed a history and physical examination of Justin [name] and discussed his management with Dr. Gwendolyn Grant.  I agree with the history, physical, assessment, and plan of care, with the following exceptions: None   I was present for the following procedures: None Time Spent in Critical Care of the patient: None Time spent in discussions with the patient and family: 10  Katty Fretwell Corlis Leak, MD 12/16/12 1328

## 2012-12-22 ENCOUNTER — Ambulatory Visit: Payer: BC Managed Care – PPO | Admitting: Family

## 2012-12-29 ENCOUNTER — Encounter: Payer: Self-pay | Admitting: Family

## 2012-12-29 ENCOUNTER — Ambulatory Visit (INDEPENDENT_AMBULATORY_CARE_PROVIDER_SITE_OTHER): Payer: BC Managed Care – PPO | Admitting: Family

## 2012-12-29 VITALS — BP 200/112 | HR 80 | Temp 98.8°F | Resp 16 | Ht 71.5 in | Wt 244.0 lb

## 2012-12-29 DIAGNOSIS — E876 Hypokalemia: Secondary | ICD-10-CM

## 2012-12-29 DIAGNOSIS — I10 Essential (primary) hypertension: Secondary | ICD-10-CM

## 2012-12-29 MED ORDER — LISINOPRIL 20 MG PO TABS: 20.0000 mg | ORAL_TABLET | Freq: Every day | ORAL | Status: DC

## 2012-12-29 MED ORDER — METOPROLOL SUCCINATE ER 50 MG PO TB24: 50.0000 mg | ORAL_TABLET | Freq: Every day | ORAL | Status: DC

## 2012-12-29 NOTE — Patient Instructions (Addendum)
Please return for BP check Wednesday- come fasting.

## 2012-12-29 NOTE — Progress Notes (Signed)
Subjective:    Patient ID: Justin [name], male    DOB: May 14, 1961, 52 y.o.   MRN: [id number]  HPI  Mr.  [name] is a 52 yr old male who presents today for follow up of his blood pressure.    Last visit toprol xl was added to his regimen. He reports that he has been seeing 130's systolic at home. Reports that his headaches have improved. Denies chest pain. He has not taken his lisinopril for the past two days.      Review of Systems See HPI  Past Medical History  Diagnosis Date  . Hypertension   . Coronary artery disease   . Hyperlipidemia     History   Social History  . Marital Status: Married    Spouse Name: N/A    Number of Children: N/A  . Years of Education: N/A   Occupational History  . Not on file.   Social History Main Topics  . Smoking status: Former Smoker -- 0.50 packs/day    Types: Cigarettes    Quit date: 12/09/2012  . Smokeless tobacco: Not on file  . Alcohol Use: Yes  . Drug Use: No  . Sexually Active: Not on file   Other Topics Concern  . Not on file   Social History Narrative   Married (second marriage)   Grown son/daughter    Step 3 children   Time Research scientist (medical)   Stopped smoking recently.     Enjoys television and music, computer.    Past Surgical History  Procedure Laterality Date  . Angioplasty    . Lipoma excision      9th grade    Family History  Problem Relation Age of Onset  . Diabetes Mother   . Heart disease Mother   . Cancer Father     colon  . Hypertension Maternal Uncle   . Heart disease Paternal Uncle   . Diabetes Maternal Grandmother   . Hyperlipidemia Neg Hx     No Known Allergies  Current Outpatient Prescriptions on File Prior to Visit  Medication Sig Dispense Refill  . aspirin EC 81 MG tablet Take 1 tablet (81 mg total) by mouth daily.      Marland Kitchen lisinopril (PRINIVIL,ZESTRIL) 20 MG tablet Take 20 mg by mouth daily.      . metoprolol succinate (TOPROL-XL) 50 MG 24 hr tablet Take 1 tablet (50 mg  total) by mouth daily. Take with or immediately following a meal.  30 tablet  0  . zolpidem (AMBIEN) 5 MG tablet Take 1 tablet (5 mg total) by mouth at bedtime as needed for sleep.  15 tablet  0  . Rosuvastatin Calcium (CRESTOR PO) Take by mouth.       No current facility-administered medications on file prior to visit.    BP 200/112  Pulse 80  Temp(Src) 98.8 F (37.1 C) (Oral)  Resp 16  Ht 5' 11.5" (1.816 m)  Wt 244 lb (110.678 kg)  BMI 33.56 kg/m2  SpO2 99%       Objective:   Physical Exam  Constitutional: He is oriented to person, place, and time. He appears well-developed and well-nourished. No distress.  Cardiovascular: Normal rate and regular rhythm.   Pulmonary/Chest: Effort normal and breath sounds normal. No respiratory distress. He has no wheezes. He has no rales. He exhibits no tenderness.  Musculoskeletal: He exhibits no edema.  Neurological: He is alert and oriented to person, place, and time.  Skin: Skin is warm and dry.  Psychiatric: He has a normal mood and affect. His behavior is normal. Thought content normal.          Assessment & Plan:   

## 2012-12-30 ENCOUNTER — Encounter: Payer: Self-pay | Admitting: *Deleted

## 2012-12-30 ENCOUNTER — Encounter: Payer: Self-pay | Admitting: Cardiology

## 2012-12-31 ENCOUNTER — Ambulatory Visit: Payer: BC Managed Care – PPO | Admitting: Family

## 2012-12-31 ENCOUNTER — Ambulatory Visit: Payer: BC Managed Care – PPO | Admitting: Cardiology

## 2013-01-01 NOTE — Assessment & Plan Note (Addendum)
bp remains uncontrolled, but pt did not take lisinopril, resume ace, close follow up torecheck bp. Check bmet.

## 2013-01-12 ENCOUNTER — Ambulatory Visit (INDEPENDENT_AMBULATORY_CARE_PROVIDER_SITE_OTHER): Payer: BC Managed Care – PPO | Admitting: Family

## 2013-01-12 ENCOUNTER — Encounter: Payer: Self-pay | Admitting: Family

## 2013-01-12 ENCOUNTER — Telehealth: Payer: Self-pay | Admitting: *Deleted

## 2013-01-12 VITALS — BP 200/110 | HR 74 | Temp 98.3°F | Resp 16 | Ht 71.5 in | Wt 245.0 lb

## 2013-01-12 DIAGNOSIS — I10 Essential (primary) hypertension: Secondary | ICD-10-CM

## 2013-01-12 MED ORDER — CLONIDINE HCL 0.1 MG PO TABS: 0.1000 mg | ORAL_TABLET | Freq: Two times a day (BID) | ORAL | Status: DC

## 2013-01-12 MED ORDER — AMLODIPINE BESYLATE 5 MG PO TABS: 5.0000 mg | ORAL_TABLET | Freq: Every day | ORAL | Status: DC

## 2013-01-12 MED ORDER — CLONIDINE HCL 0.1 MG PO TABS: 0.2000 mg | ORAL_TABLET | Freq: Once | ORAL | Status: DC

## 2013-01-12 NOTE — Progress Notes (Signed)
Subjective:    Patient ID: Justin [name], male    DOB: 1960/12/28, 52 y.o.   MRN: [id number]  HPI  Mr. Schramm is a 52 yr old male who presents today for follow up of his uncontrolled htn. He is currently on lisinopril and reports an associated dry cough. He reports that the cough is not bothersome enough to discontinue the medication.  HE also continues metoprolol.  He reports that he went running this morning. He wants to lose weight.     Review of Systems    see HPI  Past Medical History  Diagnosis Date  . Hypertension   . Coronary artery disease   . Hyperlipidemia   . ANEMIA-NOS   . DIVERTICULOSIS OF COLON     History   Social History  . Marital Status: Married    Spouse Name: N/A    Number of Children: N/A  . Years of Education: N/A   Occupational History  . Not on file.   Social History Main Topics  . Smoking status: Former Smoker -- 0.50 packs/day    Types: Cigarettes    Quit date: 12/09/2012  . Smokeless tobacco: Not on file  . Alcohol Use: Yes  . Drug Use: No  . Sexually Active: Not on file   Other Topics Concern  . Not on file   Social History Narrative   Married (second marriage)   Grown son/daughter    Step 3 children   Time Research scientist (medical)   Stopped smoking recently.     Enjoys television and music, computer.    Past Surgical History  Procedure Laterality Date  . Angioplasty    . Lipoma excision      9th grade    Family History  Problem Relation Age of Onset  . Diabetes Mother   . Heart disease Mother   . Cancer Father     colon  . Hypertension Maternal Uncle   . Heart disease Paternal Uncle   . Diabetes Maternal Grandmother   . Hyperlipidemia Neg Hx     No Known Allergies  Current Outpatient Prescriptions on File Prior to Visit  Medication Sig Dispense Refill  . aspirin EC 81 MG tablet Take 1 tablet (81 mg total) by mouth daily.      Marland Kitchen lisinopril (PRINIVIL,ZESTRIL) 20 MG tablet Take 1 tablet (20 mg total) by mouth  daily.  30 tablet  2  . metoprolol succinate (TOPROL-XL) 50 MG 24 hr tablet Take 1 tablet (50 mg total) by mouth daily. Take with or immediately following a meal.  30 tablet  2  . Rosuvastatin Calcium (CRESTOR PO) Take by mouth.      . zolpidem (AMBIEN) 5 MG tablet Take 1 tablet (5 mg total) by mouth at bedtime as needed for sleep.  15 tablet  0   No current facility-administered medications on file prior to visit.    BP 200/110  Pulse 74  Temp(Src) 98.3 F (36.8 C) (Oral)  Resp 16  Ht 5' 11.5" (1.816 m)  Wt 245 lb (111.131 kg)  BMI 33.7 kg/m2  SpO2 99%    Objective:   Physical Exam  Constitutional: He is oriented to person, place, and time. He appears well-developed and well-nourished. No distress.  Cardiovascular: Normal rate and regular rhythm.   No murmur heard. Pulmonary/Chest: Effort normal and breath sounds normal. No respiratory distress. He has no wheezes. He has no rales. He exhibits no tenderness.  Musculoskeletal: He exhibits no edema.  Neurological: He is  alert and oriented to person, place, and time.  Psychiatric: He has a normal mood and affect. His behavior is normal. Judgment and thought content normal.          Assessment & Plan:

## 2013-01-12 NOTE — Patient Instructions (Addendum)
Complete lab work and urine and return to the lab. You will be contacted about your renal ultrasound. Hold off on any further cardio exercise.  Start amlodipine and clonidine. Check blood pressures daily and contact us with your results for the next week. Follow up in 1 week.

## 2013-01-12 NOTE — Assessment & Plan Note (Signed)
BP remains very uncontrolled.  Pt was given clonidine 0.2mg  PO in the office with a 20 point drop in sbp. Will resume amlodipine, continue beta blocker, ace, add clonidine at home. Obtain bmet today. Will also add in urine metanephrines to help exclude pheochromocytoma, and order a renal artery duplex, aldo levels to further evaluate and rule out secondary htn.  Pt is instructed to avoid cardio exercise until the BP is improved.

## 2013-01-12 NOTE — Telephone Encounter (Signed)
Completed FMLA paperwork for BP was completed and faxed to [facility] on 01/06/13. Phone) [phone]  Fax) [phone]. Pt requested per his employer to include recent episode from 12/10/12 through 12/15/12 in section 5 of forms as well. Forms update and refaxed to above #. Pt aware.

## 2013-01-13 ENCOUNTER — Encounter: Payer: Self-pay | Admitting: Family

## 2013-01-13 ENCOUNTER — Telehealth: Payer: Self-pay | Admitting: Family

## 2013-01-13 LAB — LIPID PANEL
Total CHOL/HDL Ratio: 4.7 Ratio
Triglycerides: 135 mg/dL (ref <150)

## 2013-01-13 LAB — HEPATIC FUNCTION PANEL
AST: 20 U/L (ref 0–37)
Alkaline Phosphatase: 77 U/L (ref 39–117)
Total Bilirubin: 0.4 mg/dL (ref 0.3–1.2)

## 2013-01-13 LAB — BASIC METABOLIC PANEL WITH GFR
BUN: 14 mg/dL (ref 6–23)
Chloride: 104 mEq/L (ref 96–112)
Creat: 1.22 mg/dL (ref 0.50–1.35)
GFR, Est African American: 78 mL/min
GFR, Est Non African American: 67 mL/min
Potassium: 4.9 mEq/L (ref 3.5–5.3)
Sodium: 140 mEq/L (ref 135–145)

## 2013-01-13 NOTE — Telephone Encounter (Signed)
Pls call pt and let him know that cholesterol above goal. What dose of crestor is he taking. I would like to increase dose. Kidney function, liver function normal.

## 2013-01-13 NOTE — Telephone Encounter (Signed)
Left detailed message on cell# to call with medication dose.

## 2013-01-16 LAB — ALDOSTERONE + RENIN ACTIVITY W/ RATIO: PRA LC/MS/MS: 0.63 ng/mL/h (ref 0.25–5.82)

## 2013-01-16 NOTE — Telephone Encounter (Signed)
Notified pt. He states that he has been off of Crestor for several months because his previous doctor told him his levels were excellent. He thinks he was on 5mg  daily.  Please advise.

## 2013-01-16 NOTE — Telephone Encounter (Signed)
Called pt. Left message Recommend that he work on low cholesterol diet.  OK to remain off of crestor. Asked pt to call me Monday with BP readings.

## 2013-01-19 ENCOUNTER — Ambulatory Visit: Payer: BC Managed Care – PPO | Admitting: Family

## 2013-01-21 ENCOUNTER — Ambulatory Visit: Payer: BC Managed Care – PPO | Admitting: Cardiology

## 2013-01-21 ENCOUNTER — Ambulatory Visit: Payer: BC Managed Care – PPO | Admitting: Family

## 2013-02-05 DIAGNOSIS — R0989 Other specified symptoms and signs involving the circulatory and respiratory systems: Secondary | ICD-10-CM

## 2013-02-08 ENCOUNTER — Other Ambulatory Visit: Payer: Self-pay | Admitting: Family

## 2013-02-09 NOTE — Telephone Encounter (Signed)
Metoprolol request denied as pt should have refills on file from 12/29/12 rx, #30 x 2 refills.

## 2013-04-17 ENCOUNTER — Other Ambulatory Visit: Payer: Self-pay | Admitting: Family

## 2013-04-25 ENCOUNTER — Other Ambulatory Visit: Payer: Self-pay | Admitting: Family

## 2014-07-01 ENCOUNTER — Encounter (HOSPITAL_BASED_OUTPATIENT_CLINIC_OR_DEPARTMENT_OTHER): Payer: Self-pay | Admitting: Emergency Medicine

## 2014-07-01 ENCOUNTER — Inpatient Hospital Stay (HOSPITAL_BASED_OUTPATIENT_CLINIC_OR_DEPARTMENT_OTHER)
Admission: EM | Admit: 2014-07-01 | Discharge: 2014-07-04 | DRG: 305 | Disposition: A | Discharge status: Home / Self Care | Payer: BC Managed Care – PPO | Attending: Cardiovascular Disease | Admitting: Cardiovascular Disease

## 2014-07-01 ENCOUNTER — Emergency Department (HOSPITAL_BASED_OUTPATIENT_CLINIC_OR_DEPARTMENT_OTHER): Payer: BC Managed Care – PPO

## 2014-07-01 DIAGNOSIS — I2510 Atherosclerotic heart disease of native coronary artery without angina pectoris: Secondary | ICD-10-CM | POA: Diagnosis present

## 2014-07-01 DIAGNOSIS — E876 Hypokalemia: Secondary | ICD-10-CM | POA: Diagnosis present

## 2014-07-01 DIAGNOSIS — I10 Essential (primary) hypertension: Principal | ICD-10-CM | POA: Diagnosis present

## 2014-07-01 DIAGNOSIS — Z87891 Personal history of nicotine dependence: Secondary | ICD-10-CM

## 2014-07-01 DIAGNOSIS — Z7982 Long term (current) use of aspirin: Secondary | ICD-10-CM

## 2014-07-01 DIAGNOSIS — D509 Iron deficiency anemia, unspecified: Secondary | ICD-10-CM | POA: Diagnosis present

## 2014-07-01 DIAGNOSIS — E785 Hyperlipidemia, unspecified: Secondary | ICD-10-CM | POA: Diagnosis present

## 2014-07-01 DIAGNOSIS — I5021 Acute systolic (congestive) heart failure: Secondary | ICD-10-CM | POA: Diagnosis present

## 2014-07-01 DIAGNOSIS — Z833 Family history of diabetes mellitus: Secondary | ICD-10-CM

## 2014-07-01 DIAGNOSIS — Z6831 Body mass index (BMI) 31.0-31.9, adult: Secondary | ICD-10-CM

## 2014-07-01 DIAGNOSIS — R0603 Acute respiratory distress: Secondary | ICD-10-CM

## 2014-07-01 DIAGNOSIS — R0602 Shortness of breath: Secondary | ICD-10-CM | POA: Diagnosis present

## 2014-07-01 DIAGNOSIS — I509 Heart failure, unspecified: Secondary | ICD-10-CM

## 2014-07-01 DIAGNOSIS — Z8249 Family history of ischemic heart disease and other diseases of the circulatory system: Secondary | ICD-10-CM

## 2014-07-01 LAB — CBC
HCT: 28.2 % — ABNORMAL LOW (ref 39.0–52.0)
HCT: 26.3 % — ABNORMAL LOW (ref 39.0–52.0)
Hemoglobin: 9.3 g/dL — ABNORMAL LOW (ref 13.0–17.0)
Hemoglobin: 8.7 g/dL — ABNORMAL LOW (ref 13.0–17.0)
MCH: 29.5 pg (ref 26.0–34.0)
MCH: 28.9 pg (ref 26.0–34.0)
MCHC: 33 g/dL (ref 30.0–36.0)
MCHC: 33.1 g/dL (ref 30.0–36.0)
MCV: 89.5 fL (ref 78.0–100.0)
MCV: 87.4 fL (ref 78.0–100.0)
PLATELETS: 305 10*3/uL (ref 150–400)
PLATELETS: 281 10*3/uL (ref 150–400)
RBC: 3.15 MIL/uL — ABNORMAL LOW (ref 4.22–5.81)
RBC: 3.01 MIL/uL — ABNORMAL LOW (ref 4.22–5.81)
RDW: 13.4 % (ref 11.5–15.5)
RDW: 14 % (ref 11.5–15.5)
WBC: 11.1 10*3/uL — AB (ref 4.0–10.5)
WBC: 9.5 10*3/uL (ref 4.0–10.5)

## 2014-07-01 LAB — COMPREHENSIVE METABOLIC PANEL
ALBUMIN: 3.6 g/dL (ref 3.5–5.2)
ALK PHOS: 93 U/L (ref 39–117)
ALT: 12 U/L (ref 0–53)
ANION GAP: 14 (ref 5–15)
AST: 20 U/L (ref 0–37)
BUN: 15 mg/dL (ref 6–23)
CHLORIDE: 99 meq/L (ref 96–112)
CO2: 26 mEq/L (ref 19–32)
Calcium: 9.5 mg/dL (ref 8.4–10.5)
Creatinine, Ser: 1.4 mg/dL — ABNORMAL HIGH (ref 0.50–1.35)
GFR calc Af Amer: 64 mL/min — ABNORMAL LOW (ref >90)
GFR calc non Af Amer: 56 mL/min — ABNORMAL LOW (ref >90)
Glucose, Bld: 111 mg/dL — ABNORMAL HIGH (ref 70–99)
POTASSIUM: 3.1 meq/L — AB (ref 3.7–5.3)
SODIUM: 139 meq/L (ref 137–147)
TOTAL PROTEIN: 7.5 g/dL (ref 6.0–8.3)
Total Bilirubin: 1.2 mg/dL (ref 0.3–1.2)

## 2014-07-01 LAB — CREATININE, SERUM
Creatinine, Ser: 1.34 mg/dL (ref 0.50–1.35)
GFR calc Af Amer: 68 mL/min — ABNORMAL LOW (ref >90)
GFR calc non Af Amer: 59 mL/min — ABNORMAL LOW (ref >90)

## 2014-07-01 LAB — PRO B NATRIURETIC PEPTIDE: Pro B Natriuretic peptide (BNP): 1754 pg/mL — ABNORMAL HIGH (ref 0–125)

## 2014-07-01 LAB — TROPONIN I
Troponin I: <0.3 ng/mL (ref <0.30)
Troponin I: <0.3 ng/mL (ref <0.30)

## 2014-07-01 MED ORDER — CLONIDINE HCL 0.1 MG PO TABS
0.1000 mg | ORAL_TABLET | Freq: Two times a day (BID) | ORAL | Status: DC
  Administered 2014-07-01 – 2014-07-04 (×6): 0.1 mg via ORAL
  Filled 2014-07-01 (×7): qty 1

## 2014-07-01 MED ORDER — FUROSEMIDE 10 MG/ML IJ SOLN
40.0000 mg | Freq: Once | INTRAMUSCULAR | Status: AC
  Administered 2014-07-01: 40 mg via INTRAVENOUS
  Filled 2014-07-01: qty 4

## 2014-07-01 MED ORDER — IPRATROPIUM-ALBUTEROL 0.5-2.5 (3) MG/3ML IN SOLN
3.0000 mL | Freq: Once | RESPIRATORY_TRACT | Status: AC
  Administered 2014-07-01: 3 mL via RESPIRATORY_TRACT

## 2014-07-01 MED ORDER — IPRATROPIUM-ALBUTEROL 0.5-2.5 (3) MG/3ML IN SOLN
RESPIRATORY_TRACT | Status: AC
  Administered 2014-07-01: 3 mL via RESPIRATORY_TRACT
  Filled 2014-07-01: qty 3

## 2014-07-01 MED ORDER — NITROGLYCERIN IN D5W 200-5 MCG/ML-% IV SOLN
2.0000 ug/min | Freq: Once | INTRAVENOUS | Status: AC
  Administered 2014-07-01: 5 ug/min via INTRAVENOUS
  Filled 2014-07-01: qty 250

## 2014-07-01 MED ORDER — ALBUTEROL SULFATE (2.5 MG/3ML) 0.083% IN NEBU
INHALATION_SOLUTION | RESPIRATORY_TRACT | Status: AC
  Administered 2014-07-01: 2.5 mg via RESPIRATORY_TRACT
  Filled 2014-07-01: qty 3

## 2014-07-01 MED ORDER — SODIUM CHLORIDE 0.9 % IV SOLN: 250.0000 mL | INTRAVENOUS | Status: DC | PRN

## 2014-07-01 MED ORDER — SODIUM CHLORIDE 0.9 % IJ SOLN: 3.0000 mL | INTRAMUSCULAR | Status: DC | PRN

## 2014-07-01 MED ORDER — POTASSIUM CHLORIDE CRYS ER 20 MEQ PO TBCR
20.0000 meq | EXTENDED_RELEASE_TABLET | Freq: Three times a day (TID) | ORAL | Status: AC
  Administered 2014-07-01 – 2014-07-03 (×6): 20 meq via ORAL
  Filled 2014-07-01 (×6): qty 1

## 2014-07-01 MED ORDER — SODIUM CHLORIDE 0.9 % IJ SOLN
3.0000 mL | Freq: Two times a day (BID) | INTRAMUSCULAR | Status: DC
  Administered 2014-07-01 – 2014-07-04 (×6): 3 mL via INTRAVENOUS

## 2014-07-01 MED ORDER — ASPIRIN EC 81 MG PO TBEC
81.0000 mg | DELAYED_RELEASE_TABLET | Freq: Every day | ORAL | Status: DC
  Administered 2014-07-01 – 2014-07-02 (×2): 81 mg via ORAL
  Filled 2014-07-01 (×2): qty 1

## 2014-07-01 MED ORDER — ACETAMINOPHEN 325 MG PO TABS: 650.0000 mg | ORAL_TABLET | ORAL | Status: DC | PRN

## 2014-07-01 MED ORDER — ALBUTEROL SULFATE (2.5 MG/3ML) 0.083% IN NEBU
2.5000 mg | INHALATION_SOLUTION | Freq: Once | RESPIRATORY_TRACT | Status: AC
  Administered 2014-07-01: 2.5 mg via RESPIRATORY_TRACT

## 2014-07-01 MED ORDER — ONDANSETRON HCL 4 MG/2ML IJ SOLN: 4.0000 mg | Freq: Four times a day (QID) | INTRAMUSCULAR | Status: DC | PRN

## 2014-07-01 MED ORDER — CARVEDILOL 3.125 MG PO TABS
3.1250 mg | ORAL_TABLET | Freq: Two times a day (BID) | ORAL | Status: DC
Start: 2014-07-02 — End: 2014-07-02
  Administered 2014-07-02: 3.125 mg via ORAL
  Filled 2014-07-01 (×3): qty 1

## 2014-07-01 MED ORDER — HEPARIN SODIUM (PORCINE) 5000 UNIT/ML IJ SOLN
5000.0000 [IU] | Freq: Three times a day (TID) | INTRAMUSCULAR | Status: DC
  Administered 2014-07-01 – 2014-07-04 (×7): 5000 [IU] via SUBCUTANEOUS
  Filled 2014-07-01 (×11): qty 1

## 2014-07-01 MED ORDER — NITROGLYCERIN IN D5W 200-5 MCG/ML-% IV SOLN
2.0000 ug/min | Freq: Once | INTRAVENOUS | Status: AC
  Administered 2014-07-01: 50 ug/min via INTRAVENOUS

## 2014-07-01 MED ORDER — ZOLPIDEM TARTRATE 5 MG PO TABS: 5.0000 mg | ORAL_TABLET | Freq: Every evening | ORAL | Status: DC | PRN

## 2014-07-01 MED ORDER — AMLODIPINE BESYLATE 5 MG PO TABS
5.0000 mg | ORAL_TABLET | Freq: Every day | ORAL | Status: DC
  Administered 2014-07-02 – 2014-07-04 (×3): 5 mg via ORAL
  Filled 2014-07-01 (×3): qty 1

## 2014-07-01 NOTE — ED Provider Notes (Signed)
CSN: [id number]     Arrival date & time 07/01/14  1638 History   First MD Initiated Contact with Patient 07/01/14 1652     Chief Complaint  Patient presents with  . Shortness of Breath     (Consider location/radiation/quality/duration/timing/severity/associated sxs/prior Treatment) Patient is a 53 y.o. male presenting with shortness of breath. The history is provided by the patient.  Shortness of Breath Severity:  Moderate Onset quality:  Gradual Duration:  2 weeks Timing:  Constant Progression:  Worsening Chronicity:  New Context: not occupational exposure and not URI   Relieved by:  Nothing Worsened by:  Nothing tried Associated symptoms: cough   Associated symptoms: no abdominal pain, no chest pain, no fever, no rash, no syncope and no vomiting     Past Medical History  Diagnosis Date  . Hypertension   . Coronary artery disease   . Hyperlipidemia   . ANEMIA-NOS   . DIVERTICULOSIS OF COLON    Past Surgical History  Procedure Laterality Date  . Angioplasty    . Lipoma excision      9th grade   Family History  Problem Relation Age of Onset  . Diabetes Mother   . Heart disease Mother   . Cancer Father     colon  . Hypertension Maternal Uncle   . Heart disease Paternal Uncle   . Diabetes Maternal Grandmother   . Hyperlipidemia Neg Hx    History  Substance Use Topics  . Smoking status: Former Smoker -- 0.50 packs/day    Types: Cigarettes    Quit date: 12/09/2012  . Smokeless tobacco: Not on file  . Alcohol Use: Yes    Review of Systems  Constitutional: Negative for fever and chills.  Respiratory: Positive for cough and shortness of breath.   Cardiovascular: Negative for chest pain, leg swelling and syncope.  Gastrointestinal: Negative for vomiting and abdominal pain.  Skin: Negative for rash.  All other systems reviewed and are negative.     Allergies  Review of patient's allergies indicates no known allergies.  Home Medications   Prior to  Admission medications   Medication Sig Start Date End Date Taking? Authorizing Provider  amLODipine (NORVASC) 5 MG tablet Take 1 tablet (5 mg total) by mouth daily. 04/17/13   Sandford CrazeMelissa O'Sullivan, NP  aspirin EC 81 MG tablet Take 1 tablet (81 mg total) by mouth daily. 12/15/12   Sandford CrazeMelissa O'Sullivan, NP  cloNIDine (CATAPRES) 0.1 MG tablet Take 1 tablet (0.1 mg total) by mouth 2 (two) times daily. 04/17/13   Sandford CrazeMelissa O'Sullivan, NP  lisinopril (PRINIVIL,ZESTRIL) 20 MG tablet TAKE 1 TABLET (20 MG TOTAL) BY MOUTH DAILY. 04/25/13   Sandford CrazeMelissa O'Sullivan, NP  metoprolol succinate (TOPROL-XL) 50 MG 24 hr tablet Take 1 tablet (50 mg total) by mouth daily. Take with or immediately following a meal. 12/29/12   Sandford CrazeMelissa O'Sullivan, NP  zolpidem (AMBIEN) 5 MG tablet Take 1 tablet (5 mg total) by mouth at bedtime as needed for sleep. 12/09/12   Nelia Shiobert L Beaton, MD   BP 230/123  Pulse 113  Temp(Src) 98.4 F (36.9 C) (Oral)  Resp 26  Ht 6\' 2"  (1.88 m)  Wt 249 lb (112.946 kg)  BMI 31.96 kg/m2  SpO2 90% Physical Exam  Nursing note and vitals reviewed. Constitutional: He is oriented to person, place, and time. He appears well-developed and well-nourished. No distress.  HENT:  Head: Normocephalic and atraumatic.  Mouth/Throat: No oropharyngeal exudate.  Eyes: EOM are normal. Pupils are equal, round, and reactive to  light.  Neck: Normal range of motion. Neck supple. JVD (mild) present.  Cardiovascular: Normal rate and regular rhythm.  Exam reveals no friction rub.   No murmur heard. Pulmonary/Chest: He is in respiratory distress (mild). He has decreased breath sounds (diffusely). He has no wheezes. He has no rhonchi. He has no rales.  Abdominal: He exhibits no distension. There is no tenderness. There is no rebound.  Musculoskeletal: Normal range of motion. He exhibits edema (1+ bilaterally ).  Neurological: He is alert and oriented to person, place, and time.  Skin: He is not diaphoretic.    ED Course  Procedures  (including critical care time) Labs Review Labs Reviewed  CBC  COMPREHENSIVE METABOLIC PANEL  PRO B NATRIURETIC PEPTIDE  TROPONIN I    Imaging Review Dg Chest Port 1 View  07/01/2014   CLINICAL DATA:  Shortness of breath  EXAM: PORTABLE CHEST - 1 VIEW  COMPARISON:  12/09/2012  FINDINGS: Bilateral mild interstitial thickening with prominence of the central pulmonary vasculature. No pleural effusion or pneumothorax. Stable cardiomediastinal silhouette. Unremarkable osseous structures.  IMPRESSION: Overall findings most concerning for mild pulmonary edema.   Electronically Signed   By: Elige Ko   On: 07/01/2014 17:46     EKG Interpretation   Date/Time:  Thursday July 01 2014 16:45:23 EDT Ventricular Rate:  115 PR Interval:  138 QRS Duration: 94 QT Interval:  366 QTC Calculation: 506 R Axis:   9 Text Interpretation:  Sinus tachycardia Possible Left atrial enlargement  Left ventricular hypertrophy with repolarization abnormality Similar to  prior Confirmed by Gwendolyn Grant  MD, Walther Sanagustin ([id number]) on 07/01/2014 4:53:04 PM       CRITICAL CARE Performed by: Dagmar Hait   Total critical care time: 30 minutes  Critical care time was exclusive of separately billable procedures and treating other patients.  Critical care was necessary to treat or prevent imminent or life-threatening deterioration.  Critical care was time spent personally by me on the following activities: development of treatment plan with patient and/or surrogate as well as nursing, discussions with consultants, evaluation of patient's response to treatment, examination of patient, obtaining history from patient or surrogate, ordering and performing treatments and interventions, ordering and review of laboratory studies, ordering and review of radiographic studies, pulse oximetry and re-evaluation of patient's condition.   MDM   Final diagnoses:  Acute congestive heart failure, unspecified congestive heart  failure type  Acute respiratory distress    53 year old male with history of hypertension, coronary artery disease presents with shortness of breath. Gradually worsening over past 2 weeks. Denies any chest pain, but is having cough with occasional productivity. At urgent care chest x-ray was concerning for pulmonary edema and he was sent to the ED. Here he is afebrile, but heart rates in the 110s. Blood pressures are markedly elevated, initial pressure 239/130. I am concerned for possible CHF exacerbation. He has no rales on exam but decreased lung sounds diffusely. His heart rate may be elevated due to receiving a breathing treatment prior to arrival. He said this helped his breathing. His extremities showed some mild edema and some mild JVD. Will start nitroglycerin for his blood pressure, repeat labs and chest x-ray here. EKG similar to prior. IV NTG started for his SOB. Patient's BP improving and feeling better with NTG. Labs show BNP elevation. Lasix given. Admitted to stepdown at [facility]   Elwin Mocha, MD 07/01/14 [phone]

## 2014-07-01 NOTE — ED Notes (Signed)
[facility]. Recently quit smoking, now having shortness of breath and palpitations. Possible fever

## 2014-07-01 NOTE — H&P (Signed)
Referring Physician:  [name] is an 53 y.o. male.                       Chief Complaint: Shortness of breath  HPI: 53 year old male with 2 week history of progressive shortness of breath and leg edema without chest pain. No fever or chills. H/O chronic cough. Lost mother approximately 6 months and ran out of medications x 3 months.  Past Medical History  Diagnosis Date  . Hypertension   . Coronary artery disease   . Hyperlipidemia   . ANEMIA-NOS   . DIVERTICULOSIS OF COLON       Past Surgical History  Procedure Laterality Date  . Angioplasty    . Lipoma excision      9th grade    Family History  Problem Relation Age of Onset  . Diabetes Mother   . Heart disease Mother   . Cancer Father     colon  . Hypertension Maternal Uncle   . Heart disease Paternal Uncle   . Diabetes Maternal Grandmother   . Hyperlipidemia Neg Hx    Social History:  reports that he quit smoking about 18 months ago. His smoking use included Cigarettes. He smoked 0.50 packs per day. He does not have any smokeless tobacco history on file. He reports that he drinks alcohol. He reports that he does not use illicit drugs.  Allergies: No Known Allergies  Medications Prior to Admission  Medication Sig Dispense Refill  . amLODipine (NORVASC) 5 MG tablet Take 1 tablet (5 mg total) by mouth daily.  30 tablet  2  . aspirin EC 81 MG tablet Take 1 tablet (81 mg total) by mouth daily.      . cloNIDine (CATAPRES) 0.1 MG tablet Take 1 tablet (0.1 mg total) by mouth 2 (two) times daily.  60 tablet  2  . lisinopril (PRINIVIL,ZESTRIL) 20 MG tablet TAKE 1 TABLET (20 MG TOTAL) BY MOUTH DAILY.  30 tablet  5  . metoprolol succinate (TOPROL-XL) 50 MG 24 hr tablet Take 1 tablet (50 mg total) by mouth daily. Take with or immediately following a meal.  30 tablet  2  . zolpidem (AMBIEN) 5 MG tablet Take 1 tablet (5 mg total) by mouth at bedtime as needed for sleep.  15 tablet  0    Results for orders placed during  the hospital encounter of 07/01/14 (from the past 48 hour(s))  CBC     Status: Abnormal   Collection Time    07/01/14  5:14 PM      Result Value Ref Range   WBC 11.1 (*) 4.0 - 10.5 K/uL   RBC 3.15 (*) 4.22 - 5.81 MIL/uL   Hemoglobin 9.3 (*) 13.0 - 17.0 g/dL   HCT 28.2 (*) 39.0 - 52.0 %   MCV 89.5  78.0 - 100.0 fL   MCH 29.5  26.0 - 34.0 pg   MCHC 33.0  30.0 - 36.0 g/dL   RDW 13.4  11.5 - 15.5 %   Platelets 305  150 - 400 K/uL  COMPREHENSIVE METABOLIC PANEL     Status: Abnormal   Collection Time    07/01/14  5:14 PM      Result Value Ref Range   Sodium 139  137 - 147 mEq/L   Potassium 3.1 (*) 3.7 - 5.3 mEq/L   Chloride 99  96 - 112 mEq/L   CO2 26  19 - 32 mEq/L   Glucose, Bld  111 (*) 70 - 99 mg/dL   BUN 15  6 - 23 mg/dL   Creatinine, Ser 1.40 (*) 0.50 - 1.35 mg/dL   Calcium 9.5  8.4 - 10.5 mg/dL   Total Protein 7.5  6.0 - 8.3 g/dL   Albumin 3.6  3.5 - 5.2 g/dL   AST 20  0 - 37 U/L   ALT 12  0 - 53 U/L   Alkaline Phosphatase 93  39 - 117 U/L   Total Bilirubin 1.2  0.3 - 1.2 mg/dL   GFR calc non Af Amer 56 (*) >90 mL/min   GFR calc Af Amer 64 (*) >90 mL/min   Comment: (NOTE)     The eGFR has been calculated using the CKD EPI equation.     This calculation has not been validated in all clinical situations.     eGFR's persistently <90 mL/min signify possible Chronic Kidney     Disease.   Anion gap 14  5 - 15  PRO B NATRIURETIC PEPTIDE     Status: Abnormal   Collection Time    07/01/14  5:14 PM      Result Value Ref Range   Pro B Natriuretic peptide (BNP) 1754.0 (*) 0 - 125 pg/mL  TROPONIN I     Status: None   Collection Time    07/01/14  5:14 PM      Result Value Ref Range   Troponin I <0.30  <0.30 ng/mL   Comment:            Due to the release kinetics of cTnI,     a negative result within the first hours     of the onset of symptoms does not rule out     myocardial infarction with certainty.     If myocardial infarction is still suspected,     repeat the test at  appropriate intervals.   Dg Chest Port 1 View  07/01/2014   CLINICAL DATA:  Shortness of breath  EXAM: PORTABLE CHEST - 1 VIEW  COMPARISON:  12/09/2012  FINDINGS: Bilateral mild interstitial thickening with prominence of the central pulmonary vasculature. No pleural effusion or pneumothorax. Stable cardiomediastinal silhouette. Unremarkable osseous structures.  IMPRESSION: Overall findings most concerning for mild pulmonary edema.   Electronically Signed   By: Kathreen Devoid   On: 07/01/2014 17:46    Review Of Systems Constitutional: Negative for fever and chills.  Respiratory: Positive for cough and shortness of breath.  Cardiovascular: Negative for chest pain, leg swelling and syncope.  Gastrointestinal: Negative for vomiting and abdominal pain.  Skin: Negative for rash.  All other systems reviewed and are negative.   Blood pressure 160/90, pulse 92, temperature 98.4 F (36.9 C), temperature source Oral, resp. rate 18, height _0  (1.88 m), weight 108.7 kg (239 lb 10.2 oz), SpO2 98.00%.  Physical Exam  Nursing note and vitals reviewed.  Constitutional: He appears well developed and well nourished. No distress.  HENT: Head: Normocephalic and atraumatic. Mouth/Throat: No oropharyngeal exudate. Eyes: EOM are normal. Pupils are equal, round, and reactive to light.  Neck: Normal range of motion. Neck supple. Full JVD  present.  Cardiovascular: Normal rate and regular rhythm. II/VI systolic murmur heard.  Pulmonary/Chest: Mild respiratory distress . He has no wheezes. He has no rhonchi. He has no rales.  Abdominal: He exhibits no distension. There is no tenderness. There is no rebound.  Musculoskeletal: Normal range of motion. He exhibits edema (1+ bilaterally ).  Neurological: He is alert  and oriented to person, place, and time.  Skin: He is not diaphoretic.   Assessment/Plan Acute left heart systolic failure Hypertension, uncontrolled Dyslipidemia Anemia Hypokalemia CAD  Admit/IV  lasix/Oxygen Hold ace inhibitor for cough and renal insufficiency. Replace potassium. Echocardiogram R + L heart cath when stable.  Justin Riddle, MD  07/01/2014, 9:20 PM

## 2014-07-01 NOTE — ED Notes (Signed)
MD at bedside. 

## 2014-07-02 LAB — BASIC METABOLIC PANEL
Anion gap: 15 (ref 5–15)
BUN: 17 mg/dL (ref 6–23)
CO2: 29 mEq/L (ref 19–32)
CREATININE: 1.5 mg/dL — AB (ref 0.50–1.35)
Calcium: 9 mg/dL (ref 8.4–10.5)
Chloride: 98 mEq/L (ref 96–112)
GFR calc Af Amer: 59 mL/min — ABNORMAL LOW (ref >90)
GFR, EST NON AFRICAN AMERICAN: 51 mL/min — AB (ref >90)
Glucose, Bld: 124 mg/dL — ABNORMAL HIGH (ref 70–99)
Potassium: 3 mEq/L — ABNORMAL LOW (ref 3.7–5.3)
Sodium: 142 mEq/L (ref 137–147)

## 2014-07-02 LAB — IRON AND TIBC
IRON: 12 ug/dL — AB (ref 42–135)
Saturation Ratios: 4 % — ABNORMAL LOW (ref 20–55)
TIBC: 329 ug/dL (ref 215–435)
UIBC: 317 ug/dL (ref 125–400)

## 2014-07-02 LAB — OCCULT BLOOD X 1 CARD TO LAB, STOOL: Fecal Occult Bld: NEGATIVE

## 2014-07-02 LAB — CBC WITH DIFFERENTIAL/PLATELET
Basophils Absolute: 0 10*3/uL (ref 0.0–0.1)
Basophils Relative: 0 % (ref 0–1)
EOS ABS: 0.2 10*3/uL (ref 0.0–0.7)
EOS PCT: 2 % (ref 0–5)
HCT: 26.7 % — ABNORMAL LOW (ref 39.0–52.0)
Hemoglobin: 8.7 g/dL — ABNORMAL LOW (ref 13.0–17.0)
Lymphocytes Relative: 12 % (ref 12–46)
Lymphs Abs: 1 10*3/uL (ref 0.7–4.0)
MCH: 29.4 pg (ref 26.0–34.0)
MCHC: 32.6 g/dL (ref 30.0–36.0)
MCV: 90.2 fL (ref 78.0–100.0)
Monocytes Absolute: 0.6 10*3/uL (ref 0.1–1.0)
Monocytes Relative: 7 % (ref 3–12)
Neutro Abs: 6.7 10*3/uL (ref 1.7–7.7)
Neutrophils Relative %: 79 % — ABNORMAL HIGH (ref 43–77)
PLATELETS: 276 10*3/uL (ref 150–400)
RBC: 2.96 MIL/uL — AB (ref 4.22–5.81)
RDW: 14 % (ref 11.5–15.5)
WBC: 8.5 10*3/uL (ref 4.0–10.5)

## 2014-07-02 LAB — FERRITIN: FERRITIN: 58 ng/mL (ref 22–322)

## 2014-07-02 LAB — TROPONIN I
Troponin I: <0.3 ng/mL (ref <0.30)
Troponin I: <0.3 ng/mL (ref <0.30)

## 2014-07-02 MED ORDER — CARVEDILOL 6.25 MG PO TABS
6.2500 mg | ORAL_TABLET | Freq: Two times a day (BID) | ORAL | Status: DC
  Administered 2014-07-02 – 2014-07-04 (×4): 6.25 mg via ORAL
  Filled 2014-07-02 (×6): qty 1

## 2014-07-02 MED ORDER — POTASSIUM CHLORIDE ER 10 MEQ PO TBCR
10.0000 meq | EXTENDED_RELEASE_TABLET | Freq: Once | ORAL | Status: AC
  Administered 2014-07-02: 10 meq via ORAL
  Filled 2014-07-02: qty 1

## 2014-07-02 MED ORDER — NITROGLYCERIN IN D5W 200-5 MCG/ML-% IV SOLN
2.0000 ug/min | INTRAVENOUS | Status: DC
  Administered 2014-07-02: 66.667 ug/min via INTRAVENOUS
  Administered 2014-07-02: 50 ug/min via INTRAVENOUS

## 2014-07-02 MED ORDER — PNEUMOCOCCAL VAC POLYVALENT 25 MCG/0.5ML IJ INJ
0.5000 mL | INJECTION | INTRAMUSCULAR | Status: AC
  Administered 2014-07-03: 0.5 mL via INTRAMUSCULAR
  Filled 2014-07-02: qty 0.5

## 2014-07-02 MED ORDER — LORATADINE 10 MG PO TABS
10.0000 mg | ORAL_TABLET | Freq: Every day | ORAL | Status: DC
  Administered 2014-07-02 – 2014-07-04 (×3): 10 mg via ORAL
  Filled 2014-07-02 (×3): qty 1

## 2014-07-02 MED ORDER — ISOSORB DINITRATE-HYDRALAZINE 20-37.5 MG PO TABS
1.0000 | ORAL_TABLET | Freq: Two times a day (BID) | ORAL | Status: DC
  Administered 2014-07-02 – 2014-07-04 (×5): 1 via ORAL
  Filled 2014-07-02 (×6): qty 1

## 2014-07-02 MED ORDER — NITROGLYCERIN IN D5W 200-5 MCG/ML-% IV SOLN
50.0000 ug/min | Freq: Once | INTRAVENOUS | Status: AC
  Administered 2014-07-02: 50 ug/min via INTRAVENOUS
  Filled 2014-07-02: qty 250

## 2014-07-02 NOTE — Progress Notes (Signed)
Ref: Lemont Fillers., NP   Subjective:  Feeling better. Leg edema resolved. Some nasal congestion. Hgb-8.7 was 13.1 to 14.7 in 11/2012. Hypokalemia persist.  Objective:  Vital Signs in the last 24 hours: Temp:  [97.9 F (36.6 C)-98.7 F (37.1 C)] 98.7 F (37.1 C) (08/14 0808) Pulse Rate:  [88-120] 97 (08/14 0808) Cardiac Rhythm:  [-] Normal sinus rhythm (08/14 0808) Resp:  [18-33] 25 (08/14 0808) BP: (160-239)/(78-174) 168/82 mmHg (08/14 0808) SpO2:  [90 %-100 %] 92 % (08/14 0808) Weight:  [107.8 kg (237 lb 10.5 oz)-112.946 kg (249 lb)] 107.8 kg (237 lb 10.5 oz) (08/14 0514)  Physical Exam: BP Readings from Last 1 Encounters:  07/02/14 168/82    Wt Readings from Last 1 Encounters:  07/02/14 107.8 kg (237 lb 10.5 oz)    Weight change:   HEENT: [place]/AT, Eyes-Brown, PERL, EOMI, Conjunctiva-Pale, Sclera-Non-icteric Neck: No JVD, No bruit, Trachea midline. Lungs:  Clearing, Bilateral. Cardiac:  Regular rhythm, normal S1 and S2, no S3.  Abdomen:  Soft, non-tender. Extremities:  No edema present. No cyanosis. No clubbing. CNS: AxOx3, Cranial nerves grossly intact, moves all 4 extremities. Right handed. Skin: Warm and dry.   Intake/Output from previous day: 08/13 0701 - 08/14 0700 In: 15 [I.V.:15] Out: 1650 [Urine:1650]    Lab Results: BMET    Component Value Date/Time   NA 142 07/02/2014 0250   NA 139 07/01/2014 1714   NA 140 01/12/2013 1045   K 3.0* 07/02/2014 0250   K 3.1* 07/01/2014 1714   K 4.9 01/12/2013 1045   CL 98 07/02/2014 0250   CL 99 07/01/2014 1714   CL 104 01/12/2013 1045   CO2 29 07/02/2014 0250   CO2 26 07/01/2014 1714   CO2 32 01/12/2013 1045   GLUCOSE 124* 07/02/2014 0250   GLUCOSE 111* 07/01/2014 1714   GLUCOSE 93 01/12/2013 1045   BUN 17 07/02/2014 0250   BUN 15 07/01/2014 1714   BUN 14 01/12/2013 1045   CREATININE 1.50* 07/02/2014 0250   CREATININE 1.34 07/01/2014 2135   CREATININE 1.40* 07/01/2014 1714   CREATININE 1.22 01/12/2013 1045   CALCIUM 9.0  07/02/2014 0250   CALCIUM 9.5 07/01/2014 1714   CALCIUM 9.7 01/12/2013 1045   GFRNONAA 51* 07/02/2014 0250   GFRNONAA 59* 07/01/2014 2135   GFRNONAA 56* 07/01/2014 1714   GFRNONAA 67 01/12/2013 1045   GFRAA 59* 07/02/2014 0250   GFRAA 68* 07/01/2014 2135   GFRAA 64* 07/01/2014 1714   GFRAA 78 01/12/2013 1045   CBC    Component Value Date/Time   WBC 8.5 07/02/2014 0250   RBC 2.96* 07/02/2014 0250   HGB 8.7* 07/02/2014 0250   HCT 26.7* 07/02/2014 0250   PLT 276 07/02/2014 0250   MCV 90.2 07/02/2014 0250   MCH 29.4 07/02/2014 0250   MCHC 32.6 07/02/2014 0250   RDW 14.0 07/02/2014 0250   LYMPHSABS 1.0 07/02/2014 0250   MONOABS 0.6 07/02/2014 0250   EOSABS 0.2 07/02/2014 0250   BASOSABS 0.0 07/02/2014 0250   HEPATIC Function Panel  Recent Labs  07/01/14 1714  PROT 7.5   HEMOGLOBIN A1C No components found with this basename: HGA1C,  MPG   CARDIAC ENZYMES Lab Results  Component Value Date   TROPONINI <0.30 07/02/2014   TROPONINI <0.30 07/02/2014   TROPONINI <0.30 07/01/2014   BNP  Recent Labs  07/01/14 1714  PROBNP 1754.0*   TSH No results found for this basename: TSH,  in the last 8760 hours CHOLESTEROL No results found for  this basename: CHOL,  in the last 8760 hours  Scheduled Meds: . amLODipine  5 mg Oral Daily  . aspirin EC  81 mg Oral Daily  . carvedilol  3.125 mg Oral BID WC  . cloNIDine  0.1 mg Oral BID  . heparin  5,000 Units Subcutaneous 3 times per day  . loratadine  10 mg Oral Daily  . [START ON 07/03/2014] pneumococcal 23 valent vaccine  0.5 mL Intramuscular Tomorrow-1000  . potassium chloride  20 mEq Oral TID  . sodium chloride  3 mL Intravenous Q12H   Continuous Infusions: . nitroGLYCERIN 66.667 mcg/min (07/02/14 0800)   PRN Meds:.sodium chloride, acetaminophen, ondansetron (ZOFRAN) IV, sodium chloride, zolpidem  Assessment/Plan: Acute left heart diastolic failure  Hypertension, uncontrolled -improving Dyslipidemia  Anemia r/o iron deficiency Hypokalemia   CAD  Continue potassium supplementation. Hold Lasix today. Nuclear stress test in AM.  DC IV NTG, 1 hour post Bidil intake.   LOS: 1 day    Orpah CobbAjay Nandika Stetzer  MD  07/02/2014, 11:08 AM

## 2014-07-02 NOTE — Progress Notes (Signed)
Echocardiogram 2D Echocardiogram has been performed.  [name] 07/02/2014, 9:03 AM

## 2014-07-03 ENCOUNTER — Inpatient Hospital Stay (HOSPITAL_COMMUNITY): Payer: BC Managed Care – PPO

## 2014-07-03 LAB — BASIC METABOLIC PANEL
Anion gap: 13 (ref 5–15)
BUN: 17 mg/dL (ref 6–23)
CO2: 27 mEq/L (ref 19–32)
CREATININE: 1.34 mg/dL (ref 0.50–1.35)
Calcium: 9 mg/dL (ref 8.4–10.5)
Chloride: 103 mEq/L (ref 96–112)
GFR calc non Af Amer: 59 mL/min — ABNORMAL LOW (ref >90)
GFR, EST AFRICAN AMERICAN: 68 mL/min — AB (ref >90)
Glucose, Bld: 93 mg/dL (ref 70–99)
Potassium: 3.4 mEq/L — ABNORMAL LOW (ref 3.7–5.3)
Sodium: 143 mEq/L (ref 137–147)

## 2014-07-03 MED ORDER — TECHNETIUM TC 99M SESTAMIBI - CARDIOLITE
10.0000 | Freq: Once | INTRAVENOUS | Status: AC | PRN
  Administered 2014-07-03: 10 via INTRAVENOUS

## 2014-07-03 MED ORDER — REGADENOSON 0.4 MG/5ML IV SOLN
INTRAVENOUS | Status: AC
  Filled 2014-07-03: qty 5

## 2014-07-03 MED ORDER — REGADENOSON 0.4 MG/5ML IV SOLN
0.4000 mg | Freq: Once | INTRAVENOUS | Status: AC
  Administered 2014-07-03: 0.4 mg via INTRAVENOUS
  Filled 2014-07-03: qty 5

## 2014-07-03 MED ORDER — TECHNETIUM TC 99M SESTAMIBI - CARDIOLITE
30.0000 | Freq: Once | INTRAVENOUS | Status: AC | PRN
  Administered 2014-07-03: 30 via INTRAVENOUS

## 2014-07-03 NOTE — Progress Notes (Signed)
Ref: Lemont Fillers., NP   Subjective:  Passed nuclear stress test with fixed defect and no reversible ischemia. EF 38 %. Afebrile.  Objective:  Vital Signs in the last 24 hours: Temp:  [97.9 F (36.6 C)-98.5 F (36.9 C)] 98 F (36.7 C) (08/15 1250) Pulse Rate:  [81-97] 91 (08/15 1116) Cardiac Rhythm:  [-] Normal sinus rhythm (08/15 1116) Resp:  [17-28] 25 (08/15 1116) BP: (143-198)/(72-101) 180/95 mmHg (08/15 1116) SpO2:  [95 %-100 %] 99 % (08/15 1116) Weight:  [107.004 kg (235 lb 14.4 oz)] 107.004 kg (235 lb 14.4 oz) (08/15 0409)  Physical Exam: BP Readings from Last 1 Encounters:  07/03/14 180/95    Wt Readings from Last 1 Encounters:  07/03/14 107.004 kg (235 lb 14.4 oz)    Weight change: -5.942 kg (-13 lb 1.6 oz)  HEENT: [place]/AT, Eyes-Brown, PERL, EOMI, Conjunctiva-Pale, Sclera-Non-icteric Neck: No JVD, No bruit, Trachea midline. Lungs:  Clear, Bilateral. Cardiac:  Regular rhythm, normal S1 and S2, no S3.  Abdomen:  Soft, non-tender. Extremities:  No edema present. No cyanosis. No clubbing. CNS: AxOx3, Cranial nerves grossly intact, moves all 4 extremities. Right handed. Skin: Warm and dry.   Intake/Output from previous day: 08/14 0701 - 08/15 0700 In: 1116 [P.O.:1016; I.V.:100] Out: 926 [Urine:925; Stool:1]    Lab Results: BMET    Component Value Date/Time   NA 143 07/03/2014 0312   NA 142 07/02/2014 0250   NA 139 07/01/2014 1714   K 3.4* 07/03/2014 0312   K 3.0* 07/02/2014 0250   K 3.1* 07/01/2014 1714   CL 103 07/03/2014 0312   CL 98 07/02/2014 0250   CL 99 07/01/2014 1714   CO2 27 07/03/2014 0312   CO2 29 07/02/2014 0250   CO2 26 07/01/2014 1714   GLUCOSE 93 07/03/2014 0312   GLUCOSE 124* 07/02/2014 0250   GLUCOSE 111* 07/01/2014 1714   BUN 17 07/03/2014 0312   BUN 17 07/02/2014 0250   BUN 15 07/01/2014 1714   CREATININE 1.34 07/03/2014 0312   CREATININE 1.50* 07/02/2014 0250   CREATININE 1.34 07/01/2014 2135   CREATININE 1.22 01/12/2013 1045   CALCIUM 9.0  07/03/2014 0312   CALCIUM 9.0 07/02/2014 0250   CALCIUM 9.5 07/01/2014 1714   GFRNONAA 59* 07/03/2014 0312   GFRNONAA 51* 07/02/2014 0250   GFRNONAA 59* 07/01/2014 2135   GFRNONAA 67 01/12/2013 1045   GFRAA 68* 07/03/2014 0312   GFRAA 59* 07/02/2014 0250   GFRAA 68* 07/01/2014 2135   GFRAA 78 01/12/2013 1045   CBC    Component Value Date/Time   WBC 8.5 07/02/2014 0250   RBC 2.96* 07/02/2014 0250   HGB 8.7* 07/02/2014 0250   HCT 26.7* 07/02/2014 0250   PLT 276 07/02/2014 0250   MCV 90.2 07/02/2014 0250   MCH 29.4 07/02/2014 0250   MCHC 32.6 07/02/2014 0250   RDW 14.0 07/02/2014 0250   LYMPHSABS 1.0 07/02/2014 0250   MONOABS 0.6 07/02/2014 0250   EOSABS 0.2 07/02/2014 0250   BASOSABS 0.0 07/02/2014 0250   HEPATIC Function Panel  Recent Labs  07/01/14 1714  PROT 7.5   HEMOGLOBIN A1C No components found with this basename: HGA1C,  MPG   CARDIAC ENZYMES Lab Results  Component Value Date   TROPONINI <0.30 07/02/2014   TROPONINI <0.30 07/02/2014   TROPONINI <0.30 07/01/2014   BNP  Recent Labs  07/01/14 1714  PROBNP 1754.0*   TSH No results found for this basename: TSH,  in the last 8760 hours CHOLESTEROL No results  found for this basename: CHOL,  in the last 8760 hours  Scheduled Meds: . amLODipine  5 mg Oral Daily  . carvedilol  6.25 mg Oral BID WC  . cloNIDine  0.1 mg Oral BID  . heparin  5,000 Units Subcutaneous 3 times per day  . isosorbide-hydrALAZINE  1 tablet Oral BID  . loratadine  10 mg Oral Daily  . potassium chloride  20 mEq Oral TID  . sodium chloride  3 mL Intravenous Q12H   Continuous Infusions:  PRN Meds:.sodium chloride, acetaminophen, ondansetron (ZOFRAN) IV, sodium chloride, zolpidem  Assessment/Plan: Acute left heart diastolic failure  Hypertension, uncontrolled -improving  Dyslipidemia  Anemia r/o iron deficiency  Hypokalemia  CAD  Continue medical treatment. Increase activity as tolerated.    LOS: 2 days    Orpah CobbAjay Ansley Mangiapane  MD  07/03/2014, 4:00  PM

## 2014-07-04 ENCOUNTER — Telehealth: Payer: Self-pay | Admitting: Family

## 2014-07-04 LAB — BASIC METABOLIC PANEL
Anion gap: 11 (ref 5–15)
BUN: 18 mg/dL (ref 6–23)
CO2: 26 mEq/L (ref 19–32)
Calcium: 8.9 mg/dL (ref 8.4–10.5)
Chloride: 102 mEq/L (ref 96–112)
Creatinine, Ser: 1.28 mg/dL (ref 0.50–1.35)
GFR calc Af Amer: 72 mL/min — ABNORMAL LOW (ref >90)
GFR calc non Af Amer: 62 mL/min — ABNORMAL LOW (ref >90)
GLUCOSE: 96 mg/dL (ref 70–99)
Potassium: 3.3 mEq/L — ABNORMAL LOW (ref 3.7–5.3)
Sodium: 139 mEq/L (ref 137–147)

## 2014-07-04 MED ORDER — HALOPERIDOL LACTATE 5 MG/ML IJ SOLN
INTRAMUSCULAR | Status: AC
  Filled 2014-07-04: qty 1

## 2014-07-04 MED ORDER — ISOSORB DINITRATE-HYDRALAZINE 20-37.5 MG PO TABS: 1.0000 | ORAL_TABLET | Freq: Two times a day (BID) | ORAL | Status: DC

## 2014-07-04 MED ORDER — FERROUS SULFATE 325 (65 FE) MG PO TABS: 325.0000 mg | ORAL_TABLET | Freq: Every day | ORAL | Status: DC

## 2014-07-04 MED ORDER — CLONIDINE HCL 0.1 MG PO TABS: 0.1000 mg | ORAL_TABLET | Freq: Two times a day (BID) | ORAL | Status: DC

## 2014-07-04 MED ORDER — FERROUS SULFATE 325 (65 FE) MG PO TABS
325.0000 mg | ORAL_TABLET | Freq: Every day | ORAL | Status: DC
  Administered 2014-07-04: 325 mg via ORAL
  Filled 2014-07-04 (×2): qty 1

## 2014-07-04 MED ORDER — POTASSIUM CHLORIDE CRYS ER 20 MEQ PO TBCR
40.0000 meq | EXTENDED_RELEASE_TABLET | Freq: Two times a day (BID) | ORAL | Status: DC
  Administered 2014-07-04: 40 meq via ORAL
  Filled 2014-07-04: qty 2

## 2014-07-04 MED ORDER — LIVING BETTER WITH HEART FAILURE BOOK
Freq: Once | Status: DC
  Filled 2014-07-04: qty 1

## 2014-07-04 MED ORDER — POTASSIUM CHLORIDE CRYS ER 20 MEQ PO TBCR: 40.0000 meq | EXTENDED_RELEASE_TABLET | Freq: Two times a day (BID) | ORAL | Status: DC

## 2014-07-04 MED ORDER — CARVEDILOL 6.25 MG PO TABS: 6.2500 mg | ORAL_TABLET | Freq: Two times a day (BID) | ORAL | Status: DC

## 2014-07-04 MED ORDER — AMLODIPINE BESYLATE 5 MG PO TABS: 5.0000 mg | ORAL_TABLET | Freq: Every day | ORAL | Status: DC

## 2014-07-04 NOTE — Telephone Encounter (Signed)
Please call pt to arrange a 1 week hospital follow up.

## 2014-07-04 NOTE — Progress Notes (Signed)
Pt discharged home via family vehicle.

## 2014-07-04 NOTE — Discharge Summary (Signed)
Physician Discharge Summary  Patient ID: Justin [name] MRN: [id number] DOB/AGE: 03/09/1961 53 y.o.  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 No fever or chills. H/O chronic cough. Lost mother approximately 6 months and ran out of medications x 3 months. His medications were resumed except ACE inhibitor for renal insufficiency which improved. He underwent nuclear stress that showed large fixed defect with inferior and inferolateral wall, hypokinesia and EF of 38 %. He was advised to take medications regularly. He will see GI doctor on OP basis and start iron supplement. He will see primary care in 1 week.  Consults: cardiology  Significant Diagnostic Studies: labs: Hgb 9.3. S. Iron very low at 12. Pro-BNP of 1754. Potassium 3.1 to 3.4 post supplement. Creatinine 1.4 on admission and 1.28 on discharge.  EKG-SR, LVH and possible lateral ischemia.  Nuclear stress test: 1. Large fixed defect within inferior and inferolateral wall consistent with infarction.  2. No evidence reversible ischemia.  3. Global hypokinesia.  4. Ejection fraction equals 38%.  Chest x-ray: mild pulmonary edema.  Treatments: cardiac meds: aspirin, carvedilol, amlodipine, Clonidine and Bidil.  Discharge Exam: Blood pressure 149/71, pulse 85, temperature 97.6 F (36.4 C), temperature source Oral, resp. rate 16, height 6\' 2"  (1.88 m), weight 110.4 kg (243 lb 6.2 oz), SpO2 98.00%. HEENT: [place]/AT, Eyes-Brown, PERL, EOMI, Conjunctiva-Pale, Sclera-Non-icteric   Neck: No JVD, No bruit, Trachea midline.  Lungs: Clear, Bilateral.  Cardiac: Regular rhythm, normal S1 and S2, no S3. II/VI systolic murmur. Abdomen: Soft, non-tender.  Extremities: No edema present. No cyanosis. No clubbing.  CNS: AxOx3, Cranial nerves grossly intact, moves all 4 extremities. Right handed.  Skin: Warm and dry.  Disposition: 01-Home or Self Care     Medication List    STOP taking these medications       DM-Phenylephrine-Acetaminophen 10-5-325 MG/15ML Liqd     GARLIC PO     lisinopril 20 MG tablet  Commonly known as:  PRINIVIL,ZESTRIL     metoprolol succinate 50 MG 24 hr tablet  Commonly known as:  TOPROL-XL      TAKE these medications       amLODipine 5 MG tablet  Commonly known as:  NORVASC  Take 1 tablet (5 mg total) by mouth daily.     aspirin EC 81 MG tablet  Take 1 tablet (81 mg total) by mouth daily.     carvedilol 6.25 MG tablet  Commonly known as:  COREG  Take 1 tablet (6.25 mg total) by mouth 2 (two) times daily with a meal.     cloNIDine 0.1 MG tablet  Commonly known as:  CATAPRES  Take 1 tablet (0.1 mg total) by mouth 2 (two) times daily.     FISH OIL PO  Take 400 mg by mouth daily.     guaiFENesin 600 MG 12 hr tablet  Commonly known as:  MUCINEX  Take 600 mg by mouth 2 (two) times daily as needed for to loosen phlegm.     isosorbide-hydrALAZINE 20-37.5 MG per tablet  Commonly known as:  BIDIL  Take 1 tablet by mouth  2 (two) times daily.     potassium chloride SA 20 MEQ tablet  Commonly known as:  K-DUR,KLOR-CON  Take 2 tablets (40 mEq total) by mouth 2 (two) times daily.      Iron 325 mg. One daily.     Follow-up Information   Follow up with Lemont Fillers'SULLIVAN,MELISSA S., NP. Schedule an appointment as soon as possible for a visit in 1 week.   Specialty:  Internal Medicine   Contact information:   [address] [place] [address] [phone]       [facility], MD. Schedule an appointment as  soon as possible for a visit in 1 month.   Specialty:  Cardiology   Contact information:   [address] [place] [address] [phone]       Signed: Ricki RodriguezKADAKIA,Leslieanne Cobarrubias S 07/04/2014, 9:07 AM

## 2014-07-04 NOTE — Progress Notes (Signed)
Discharge instructions given. No questions or concerns at this time.  

## 2014-07-05 ENCOUNTER — Encounter (HOSPITAL_COMMUNITY): Payer: Self-pay | Admitting: Emergency Medicine

## 2014-07-05 ENCOUNTER — Emergency Department (HOSPITAL_COMMUNITY): Payer: BC Managed Care – PPO

## 2014-07-05 ENCOUNTER — Emergency Department (HOSPITAL_COMMUNITY)
Admission: EM | Admit: 2014-07-05 | Discharge: 2014-07-05 | Disposition: A | Discharge status: Home / Self Care | Payer: BC Managed Care – PPO | Attending: Emergency Medicine | Admitting: Emergency Medicine

## 2014-07-05 DIAGNOSIS — R0602 Shortness of breath: Secondary | ICD-10-CM | POA: Insufficient documentation

## 2014-07-05 DIAGNOSIS — Z9889 Other specified postprocedural states: Secondary | ICD-10-CM | POA: Insufficient documentation

## 2014-07-05 DIAGNOSIS — I2510 Atherosclerotic heart disease of native coronary artery without angina pectoris: Secondary | ICD-10-CM | POA: Insufficient documentation

## 2014-07-05 DIAGNOSIS — Z9981 Dependence on supplemental oxygen: Secondary | ICD-10-CM | POA: Insufficient documentation

## 2014-07-05 DIAGNOSIS — I509 Heart failure, unspecified: Secondary | ICD-10-CM | POA: Insufficient documentation

## 2014-07-05 DIAGNOSIS — Z862 Personal history of diseases of the blood and blood-forming organs and certain disorders involving the immune mechanism: Secondary | ICD-10-CM | POA: Diagnosis not present

## 2014-07-05 DIAGNOSIS — Z87891 Personal history of nicotine dependence: Secondary | ICD-10-CM | POA: Insufficient documentation

## 2014-07-05 DIAGNOSIS — D649 Anemia, unspecified: Secondary | ICD-10-CM | POA: Diagnosis not present

## 2014-07-05 DIAGNOSIS — Z8639 Personal history of other endocrine, nutritional and metabolic disease: Secondary | ICD-10-CM | POA: Diagnosis not present

## 2014-07-05 DIAGNOSIS — I10 Essential (primary) hypertension: Secondary | ICD-10-CM | POA: Insufficient documentation

## 2014-07-05 DIAGNOSIS — Z79899 Other long term (current) drug therapy: Secondary | ICD-10-CM | POA: Insufficient documentation

## 2014-07-05 DIAGNOSIS — Z8719 Personal history of other diseases of the digestive system: Secondary | ICD-10-CM | POA: Diagnosis not present

## 2014-07-05 DIAGNOSIS — R0609 Other forms of dyspnea: Secondary | ICD-10-CM | POA: Insufficient documentation

## 2014-07-05 DIAGNOSIS — R0989 Other specified symptoms and signs involving the circulatory and respiratory systems: Principal | ICD-10-CM | POA: Insufficient documentation

## 2014-07-05 DIAGNOSIS — I160 Hypertensive urgency: Secondary | ICD-10-CM

## 2014-07-05 DIAGNOSIS — R0600 Dyspnea, unspecified: Secondary | ICD-10-CM

## 2014-07-05 DIAGNOSIS — Z7982 Long term (current) use of aspirin: Secondary | ICD-10-CM | POA: Diagnosis not present

## 2014-07-05 HISTORY — DX: Heart failure, unspecified: I50.9

## 2014-07-05 LAB — COMPREHENSIVE METABOLIC PANEL
ALBUMIN: 3.4 g/dL — AB (ref 3.5–5.2)
ALT: 17 U/L (ref 0–53)
AST: 23 U/L (ref 0–37)
Alkaline Phosphatase: 82 U/L (ref 39–117)
Anion gap: 9 (ref 5–15)
BILIRUBIN TOTAL: 0.6 mg/dL (ref 0.3–1.2)
BUN: 17 mg/dL (ref 6–23)
CHLORIDE: 107 meq/L (ref 96–112)
CO2: 27 meq/L (ref 19–32)
Calcium: 9.5 mg/dL (ref 8.4–10.5)
Creatinine, Ser: 1.35 mg/dL (ref 0.50–1.35)
GFR calc Af Amer: 67 mL/min — ABNORMAL LOW (ref >90)
GFR calc non Af Amer: 58 mL/min — ABNORMAL LOW (ref >90)
Glucose, Bld: 104 mg/dL — ABNORMAL HIGH (ref 70–99)
POTASSIUM: 4.6 meq/L (ref 3.7–5.3)
Sodium: 143 mEq/L (ref 137–147)
Total Protein: 7.1 g/dL (ref 6.0–8.3)

## 2014-07-05 LAB — CBC WITH DIFFERENTIAL/PLATELET
BASOS ABS: 0 10*3/uL (ref 0.0–0.1)
Basophils Relative: 0 % (ref 0–1)
EOS PCT: 2 % (ref 0–5)
Eosinophils Absolute: 0.2 10*3/uL (ref 0.0–0.7)
HCT: 28.5 % — ABNORMAL LOW (ref 39.0–52.0)
Hemoglobin: 9.2 g/dL — ABNORMAL LOW (ref 13.0–17.0)
LYMPHS ABS: 1 10*3/uL (ref 0.7–4.0)
Lymphocytes Relative: 12 % (ref 12–46)
MCH: 28.7 pg (ref 26.0–34.0)
MCHC: 32.3 g/dL (ref 30.0–36.0)
MCV: 88.8 fL (ref 78.0–100.0)
MONO ABS: 0.6 10*3/uL (ref 0.1–1.0)
MONOS PCT: 7 % (ref 3–12)
Neutro Abs: 7 10*3/uL (ref 1.7–7.7)
Neutrophils Relative %: 79 % — ABNORMAL HIGH (ref 43–77)
Platelets: 319 10*3/uL (ref 150–400)
RBC: 3.21 MIL/uL — AB (ref 4.22–5.81)
RDW: 13.8 % (ref 11.5–15.5)
WBC: 8.8 10*3/uL (ref 4.0–10.5)

## 2014-07-05 LAB — URINALYSIS, ROUTINE W REFLEX MICROSCOPIC
Bilirubin Urine: NEGATIVE
Glucose, UA: NEGATIVE mg/dL
HGB URINE DIPSTICK: NEGATIVE
Ketones, ur: NEGATIVE mg/dL
Leukocytes, UA: NEGATIVE
NITRITE: NEGATIVE
Protein, ur: NEGATIVE mg/dL
SPECIFIC GRAVITY, URINE: 1.014 (ref 1.005–1.030)
Urobilinogen, UA: 0.2 mg/dL (ref 0.0–1.0)
pH: 6 (ref 5.0–8.0)

## 2014-07-05 LAB — PRO B NATRIURETIC PEPTIDE: PRO B NATRI PEPTIDE: 934.1 pg/mL — AB (ref 0–125)

## 2014-07-05 MED ORDER — HYDRALAZINE HCL 25 MG PO TABS: 25.0000 mg | ORAL_TABLET | Freq: Two times a day (BID) | ORAL | Status: DC

## 2014-07-05 MED ORDER — ISOSORBIDE DINITRATE ER 40 MG PO TBCR: 40.0000 mg | EXTENDED_RELEASE_TABLET | Freq: Every day | ORAL | Status: DC

## 2014-07-05 MED ORDER — ISOSORB DINITRATE-HYDRALAZINE 20-37.5 MG PO TABS
1.0000 | ORAL_TABLET | Freq: Once | ORAL | Status: AC
Start: 2014-07-05 — End: 2014-07-05
  Administered 2014-07-05: 1 via ORAL
  Filled 2014-07-05: qty 1

## 2014-07-05 NOTE — ED Notes (Signed)
Pt states he was in the hospital on Thursday thru Sunday.  Pt states he was unable to afford the BP medication prescribed.

## 2014-07-05 NOTE — ED Notes (Signed)
Pt alert and oriented at discharge.  Pt verbalized understanding of discharge instructions.  Pt offered a wheelchair at discharge but denied.

## 2014-07-05 NOTE — ED Notes (Signed)
Dr.Lockwood at bedside  

## 2014-07-05 NOTE — ED Notes (Signed)
Pt reports recently being discharged from hospital for chf. Was unable to fill one of his prescriptions due to $. Now having increase in sob, denies swelling. ekg done at triage, airway intact.

## 2014-07-05 NOTE — ED Provider Notes (Signed)
CSN: [id number]     Arrival date & time 07/05/14  1027 History   First MD Initiated Contact with Patient 07/05/14 1036     Chief Complaint  Patient presents with  . Shortness of Breath     (Consider location/radiation/quality/duration/timing/severity/associated sxs/prior Treatment) HPI Patient presents one day after discharge with concern for persistent dyspnea, hypertension. Patient notes that on discharge she is feeling generally better than on arrival. Over the interval day patient has developed increasing dyspnea, without chest pain, lightheadedness, syncope, nausea, vomiting, fever, chills. In addition, patient has hypertension. Patient was unable to fill some of his prescriptions on discharge, do to expense.  Past Medical History  Diagnosis Date  . Hypertension   . Coronary artery disease   . Hyperlipidemia   . ANEMIA-NOS   . DIVERTICULOSIS OF COLON   . CHF (congestive heart failure)    Past Surgical History  Procedure Laterality Date  . Angioplasty    . Lipoma excision      9th grade   Family History  Problem Relation Age of Onset  . Diabetes Mother   . Heart disease Mother   . Cancer Father     colon  . Hypertension Maternal Uncle   . Heart disease Paternal Uncle   . Diabetes Maternal Grandmother   . Hyperlipidemia Neg Hx    History  Substance Use Topics  . Smoking status: Former Smoker -- 0.50 packs/day    Types: Cigarettes    Quit date: 12/09/2012  . Smokeless tobacco: Not on file  . Alcohol Use: Yes    Review of Systems  Constitutional:       Per HPI, otherwise negative  HENT:       Per HPI, otherwise negative  Respiratory:       Per HPI, otherwise negative  Cardiovascular:       Per HPI, otherwise negative  Gastrointestinal: Negative for vomiting.  Endocrine:       Negative aside from HPI  Genitourinary:       Neg aside from HPI   Musculoskeletal:       Per HPI, otherwise negative  Skin: Negative.   Neurological: Negative for syncope.       Allergies  Review of patient's allergies indicates no known allergies.  Home Medications   Prior to Admission medications   Medication Sig Start Date End Date Taking? Authorizing Provider  amLODipine (NORVASC) 5 MG tablet Take 1 tablet (5 mg total) by mouth daily. 07/04/14  Yes Ricki Rodriguez, MD  aspirin EC 81 MG tablet Take 1 tablet (81 mg total) by mouth daily. 12/15/12  Yes Sandford Craze, NP  carvedilol (COREG) 6.25 MG tablet Take 1 tablet (6.25 mg total) by mouth 2 (two) times daily with a meal. 07/04/14  Yes Ricki Rodriguez, MD  cloNIDine (CATAPRES) 0.1 MG tablet Take 1 tablet (0.1 mg total) by mouth 2 (two) times daily. 07/04/14  Yes Ricki Rodriguez, MD  ferrous sulfate 325 (65 FE) MG tablet Take 1 tablet (325 mg total) by mouth daily with breakfast. 07/04/14  Yes Ricki Rodriguez, MD  guaiFENesin (MUCINEX) 600 MG 12 hr tablet Take 600 mg by mouth 2 (two) times daily as needed for to loosen phlegm.   Yes Historical Provider, MD  Omega-3 Fatty Acids (FISH OIL PO) Take 400 mg by mouth daily.   Yes Historical Provider, MD  potassium chloride SA (K-DUR,KLOR-CON) 20 MEQ tablet Take 2 tablets (40 mEq total) by mouth 2 (two) times daily. 07/04/14  Yes Ricki RodriguezAjay S Kadakia, MD  isosorbide-hydrALAZINE (BIDIL) 20-37.5 MG per tablet Take 1 tablet by mouth 2 (two) times daily. 07/04/14   Ricki RodriguezAjay S Kadakia, MD   BP 198/97  Pulse 95  Temp(Src) 98.9 F (37.2 C) (Oral)  Resp 22  SpO2 100% Physical Exam  Nursing note and vitals reviewed. Constitutional: He is oriented to person, place, and time. He appears well-developed. No distress.  HENT:  Head: Normocephalic and atraumatic.  Eyes: Conjunctivae and EOM are normal.  Cardiovascular: Normal rate and regular rhythm.   Pulmonary/Chest: Effort normal. No stridor. No respiratory distress.  Supplemental oxygen in place  Abdominal: He exhibits no distension.  Musculoskeletal: He exhibits no edema.  Neurological: He is alert and oriented to person,  place, and time.  Skin: Skin is warm and dry.  Psychiatric: He has a normal mood and affect.    ED Course  Procedures (including critical care time) Labs Review Labs Reviewed  CBC WITH DIFFERENTIAL - Abnormal; Notable for the following:    RBC 3.21 (*)    Hemoglobin 9.2 (*)    HCT 28.5 (*)    Neutrophils Relative % 79 (*)    All other components within normal limits  PRO B NATRIURETIC PEPTIDE - Abnormal; Notable for the following:    Pro B Natriuretic peptide (BNP) 934.1 (*)    All other components within normal limits  COMPREHENSIVE METABOLIC PANEL  URINALYSIS, ROUTINE W REFLEX MICROSCOPIC    Imaging Review Nm Myocar Multi W/spect W/wall Motion / Ef  07/03/2014   CLINICAL DATA:  Short of breath, chest pain, 53 year old male  EXAM: MYOCARDIAL IMAGING WITH SPECT (REST AND PHARMACOLOGIC-STRESS)  GATED LEFT VENTRICULAR WALL MOTION STUDY  LEFT VENTRICULAR EJECTION FRACTION  TECHNIQUE: Standard myocardial SPECT imaging was performed after resting intravenous injection of 10 mCi Tc-8547m sestamibi. Subsequently, intravenous infusion of Lexiscan was performed under the supervision of the Cardiology staff. At peak effect of the drug, 30 mCi Tc-5947m sestamibi was injected intravenously and standard myocardial SPECT imaging was performed. Quantitative gated imaging was also performed to evaluate left ventricular wall motion, and estimate left ventricular ejection fraction.  COMPARISON:  Myocardial profusion scan report 02/17/2009  FINDINGS: Perfusion: Large region of decrease counts within the apical, mid, and basilar segments of the inferior wall and inferolateral wall which are fixed from stress arrest. No reversible ischemia evident.  Wall Motion: Global hypokinesia.  Ejection Fraction: 38%  End-diastolic volume equals: 217 ml  End systolic volume equals: 134 ml  IMPRESSION: 1. Large fixed defect within inferior and inferolateral wall consistent with infarction. 2. No evidence reversible ischemia. 3.  Global hypokinesia. 4. Ejection fraction equals 38%.   Electronically Signed   By: Genevive BiStewart  Edmunds M.D.   On: 07/03/2014 14:42     EKG Interpretation   Date/Time:  Monday July 05 2014 10:32:42 EDT Ventricular Rate:  99 PR Interval:  176 QRS Duration: 90 QT Interval:  380 QTC Calculation: 487 R Axis:   39 Text Interpretation:  Normal sinus rhythm Possible Left atrial enlargement  Left ventricular hypertrophy with repolarization abnormality Prolonged QT  Abnormal ECG Sinus rhythm Left ventricular hypertrophy Abnormal ekg  Confirmed by Gerhard MunchLOCKWOOD, Deepa Barthel  MD ([id number]) on 07/05/2014 10:42:28 AM     On initial evaluation the patient is on the notably hypertensive. Patient received his home medication.  2:36 PM Patient and in no distress, sitting upright. The blood pressure decreased substantially.  MDM   Patient presents one day after discharge, now with concern dyspnea, hypertension.  Patient is awake, alert, in no distress.  Patient's labs are generally better, with no evidence for new endorgan effects. Patient had blood pressure reduction with provision of medication here, received a prescription for cheaper BP meds and d/c in stable condition.     Gerhard Munch, MD 07/05/14 [phone]

## 2014-07-05 NOTE — Discharge Instructions (Signed)
As discussed, your evaluation today has been largely reassuring.  But, it is important that you monitor your condition carefully, and do not hesitate to return to the ED if you develop new, or concerning changes in your condition. ? ?Otherwise, please follow-up with your physician for appropriate ongoing care. ? ?

## 2014-07-06 NOTE — Telephone Encounter (Signed)
[facility] No available slots on [place]

## 2014-07-14 ENCOUNTER — Ambulatory Visit: Payer: BC Managed Care – PPO | Admitting: Physician Assistant

## 2014-07-21 ENCOUNTER — Telehealth: Payer: Self-pay | Admitting: Family

## 2014-07-21 NOTE — Telephone Encounter (Signed)
Pt called back and states that he dropped off FMLA and hospital papers last week and was told it would be given to [facility] as his appointment was originally scheduled with him. Appt has been r/s with [place].

## 2014-07-21 NOTE — Telephone Encounter (Signed)
Caller name: Gavino Relation to ZO:XWRU  Call back number: [phone] Pharmacy:  Reason for call:  Patient would like to know if FMLA paperwork is ready for pickup?

## 2014-07-21 NOTE — Telephone Encounter (Signed)
Did not see paperwork with PA, Daphine Deutscher. Left detailed message for pt to re-send papers and apologized for the inconvenience. Awaiting fax.

## 2014-07-21 NOTE — Telephone Encounter (Signed)
Left detailed message on cell# requesting pt call back and let us know how paperwork will be coming to [place] and what condition is FMLA being completed for?

## 2014-07-21 NOTE — Telephone Encounter (Signed)
Pt wanted to advise you fax was sent over to fax # [phone] allow up to 24hrs. Pt wanted to f/u with you and keep you informed.

## 2014-07-21 NOTE — Telephone Encounter (Signed)
I have not seen the paperwork either.

## 2014-07-21 NOTE — Telephone Encounter (Signed)
Pt has f/u with [place] on 08/02/14.  Do you know anything about FMLA paperwork?  I don't remember seeing this come through?

## 2014-07-22 NOTE — Telephone Encounter (Signed)
FMLA forms received from [place]. Pt requests forms be completed for his hypertension and recent hospitalization for CHF. Pt states that he will need forms completed by 07/25/14.  Advised pt that we will need to see him in follow up before forms can be completed as there will be new conditions added to the FMLA. Pt has appt on 08/02/14 and will ask HR if he can get an extension until then. Forms forwarded to Provider.

## 2014-07-22 NOTE — Telephone Encounter (Signed)
Noted  

## 2014-08-02 ENCOUNTER — Ambulatory Visit (INDEPENDENT_AMBULATORY_CARE_PROVIDER_SITE_OTHER): Payer: BC Managed Care – PPO | Admitting: Family

## 2014-08-02 ENCOUNTER — Encounter: Payer: Self-pay | Admitting: Family

## 2014-08-02 VITALS — BP 180/102 | HR 89 | Temp 98.0°F | Resp 18 | Ht 71.5 in | Wt 249.6 lb

## 2014-08-02 DIAGNOSIS — D509 Iron deficiency anemia, unspecified: Secondary | ICD-10-CM

## 2014-08-02 DIAGNOSIS — G4710 Hypersomnia, unspecified: Secondary | ICD-10-CM

## 2014-08-02 DIAGNOSIS — I10 Essential (primary) hypertension: Secondary | ICD-10-CM

## 2014-08-02 DIAGNOSIS — R400 Somnolence: Secondary | ICD-10-CM

## 2014-08-02 DIAGNOSIS — D649 Anemia, unspecified: Secondary | ICD-10-CM

## 2014-08-02 MED ORDER — FUROSEMIDE 20 MG PO TABS: 20.0000 mg | ORAL_TABLET | Freq: Every day | ORAL | Status: DC

## 2014-08-02 MED ORDER — CLONIDINE HCL 0.1 MG PO TABS: 0.1000 mg | ORAL_TABLET | Freq: Two times a day (BID) | ORAL | Status: DC

## 2014-08-02 MED ORDER — ISOSORBIDE DINITRATE ER 40 MG PO TBCR: 40.0000 mg | EXTENDED_RELEASE_TABLET | Freq: Every day | ORAL | Status: DC

## 2014-08-02 MED ORDER — AMLODIPINE BESYLATE 10 MG PO TABS: 10.0000 mg | ORAL_TABLET | Freq: Every day | ORAL | Status: DC

## 2014-08-02 MED ORDER — HYDRALAZINE HCL 25 MG PO TABS: 25.0000 mg | ORAL_TABLET | Freq: Three times a day (TID) | ORAL | Status: DC

## 2014-08-02 NOTE — Progress Notes (Signed)
Pre visit review using our clinic review tool, if applicable. No additional management support is needed unless otherwise documented below in the visit note. 

## 2014-08-02 NOTE — Progress Notes (Signed)
Subjective:    Patient ID: Justin [name], male    DOB: [birth year], 53 y.o.   MRN: [id number]  HPI  Mr. Hamme is a 53 yr old male who presents today for hospital follow up. Records are reviewed.   1) CHF/uncontrolled HTN- He presented to the ED on 07/01/14 with SOB. He was noted to have BNP >1700 and was diagnosed with acute left heart systolic failure. He was admitted to cardiology service.  During his hospitalization he underwent a nuclear stress test that showed a large fixed defect with inferior and inferolateral wall hypokinesia. EF was 38%. He was diuresed during his hospitalization. He was not sent home on lasix. He was discharged home on 07/04/14. He denies current SOB.   2) Anemia- The patient's Hemoglobin on 07/01/14 was 9.3.  With hydration he trended down to 8.7.  FOB testing was negative. Iron level was low at 12.  The day after he was discharged home 07/05/14 he returned to the ER with complaint of persistent dyspnea and hypertension.   3) Daytime somnolence-Reports that he has been falls asleep easily. + loud snoring. Mom had sleep apnea.     BP Readings from Last 3 Encounters:  08/02/14 180/102  07/05/14 169/89  07/04/14 140/73   Wt Readings from Last 3 Encounters:  08/02/14 249 lb 9.6 oz (113.218 kg)  07/04/14 243 lb 6.2 oz (110.4 kg)  01/12/13 245 lb (111.131 kg)     Review of Systems  Gastrointestinal: Negative for blood in stool.  Genitourinary: Negative for hematuria.   Past Medical History  Diagnosis Date  . Hypertension   . Coronary artery disease   . Hyperlipidemia   . ANEMIA-NOS   . DIVERTICULOSIS OF COLON   . CHF (congestive heart failure)     History   Social History  . Marital Status: Married    Spouse Name: N/A    Number of Children: N/A  . Years of Education: N/A   Occupational History  . Not on file.   Social History Main Topics  . Smoking status: Former Smoker -- 0.50 packs/day    Types: Cigarettes    Quit date: 12/09/2012  .  Smokeless tobacco: Not on file  . Alcohol Use: Yes  . Drug Use: No  . Sexual Activity: Not on file   Other Topics Concern  . Not on file   Social History Narrative   Married (second marriage)   Grown son/daughter    Step 3 children   Time Research scientist (medical)   Stopped smoking recently.     Enjoys television and music, computer.    Past Surgical History  Procedure Laterality Date  . Angioplasty    . Lipoma excision      9th grade    Family History  Problem Relation Age of Onset  . Diabetes Mother   . Heart disease Mother   . Cancer Father     colon  . Hypertension Maternal Uncle   . Heart disease Paternal Uncle   . Diabetes Maternal Grandmother   . Hyperlipidemia Neg Hx     No Known Allergies  Current Outpatient Prescriptions on File Prior to Visit  Medication Sig Dispense Refill  . aspirin EC 81 MG tablet Take 1 tablet (81 mg total) by mouth daily.      . carvedilol (COREG) 6.25 MG tablet Take 1 tablet (6.25 mg total) by mouth 2 (two) times daily with a meal.  60 tablet  3  . Omega-3 Fatty Acids (  FISH OIL PO) Take 400 mg by mouth daily.      . potassium chloride SA (K-DUR,KLOR-CON) 20 MEQ tablet Take 2 tablets (40 mEq total) by mouth 2 (two) times daily.  120 tablet  3   No current facility-administered medications on file prior to visit.    BP 180/102  Pulse 89  Temp(Src) 98 F (36.7 C) (Oral)  Resp 18  Ht 5' 11.5" (1.816 m)  Wt 249 lb 9.6 oz (113.218 kg)  BMI 34.33 kg/m2  SpO2 96%       Objective:   Physical Exam  Constitutional: He is oriented to person, place, and time. He appears well-developed and well-nourished. No distress.  HENT:  Head: Normocephalic and atraumatic.  Cardiovascular: Normal rate and regular rhythm.   No murmur heard. Pulmonary/Chest: Effort normal and breath sounds normal. No respiratory distress. He has no wheezes. He has no rales. He exhibits no tenderness.  Musculoskeletal:  1+ bilateral LE edema  Neurological: He  is alert and oriented to person, place, and time.  Psychiatric: He has a normal mood and affect. His behavior is normal. Judgment and thought content normal.          Assessment & Plan:

## 2014-08-02 NOTE — Patient Instructions (Addendum)
Please complete lab work prior to leaving. Take amlodipine  (second tab) when you get home, then tomorrow start  tabs once daily. Increase clonidine to 3 times daily. Start lasix  once daily. You will be contacted about your sleep study. Follow up in 2 weeks.

## 2014-08-03 ENCOUNTER — Telehealth: Payer: Self-pay | Admitting: *Deleted

## 2014-08-03 DIAGNOSIS — R400 Somnolence: Secondary | ICD-10-CM | POA: Insufficient documentation

## 2014-08-03 LAB — BASIC METABOLIC PANEL
BUN: 23 mg/dL (ref 6–23)
CHLORIDE: 102 meq/L (ref 96–112)
CO2: 27 mEq/L (ref 19–32)
Calcium: 9.6 mg/dL (ref 8.4–10.5)
Creatinine, Ser: 1.6 mg/dL — ABNORMAL HIGH (ref 0.4–1.5)
GFR: 57.63 mL/min — AB (ref >60.00)
Glucose, Bld: 88 mg/dL (ref 70–99)
POTASSIUM: 4.8 meq/L (ref 3.5–5.1)
Sodium: 136 mEq/L (ref 135–145)

## 2014-08-03 LAB — FOLATE: Folate: 6.3 ng/mL (ref >5.9)

## 2014-08-03 LAB — VITAMIN B12: Vitamin B-12: 913 pg/mL — ABNORMAL HIGH (ref 211–911)

## 2014-08-03 NOTE — Telephone Encounter (Signed)
FMLA paperwork completed on 08/02/14 and faxed to Attn: Heather at [place] [phone]) at 6:50pm.

## 2014-08-03 NOTE — Assessment & Plan Note (Signed)
Start iron supplement. Repeat CBC in 1 month, if not significantly improved at that time, will plan referral to hematology.

## 2014-08-03 NOTE — Assessment & Plan Note (Signed)
Uncontrolled.  Will add lasix for edema and BP control.  Pt advised as follows: Take amlodipine  (second tab) when you get home, then tomorrow start  tabs once daily. Increase clonidine to 3 times daily.

## 2014-08-03 NOTE — Assessment & Plan Note (Addendum)
With snoring and family hx of osa, I have strong suspicion for osa. Will refer for split night study.  He is advised not to drive if sleepy and not to take meds that could sedate him such as benadryl.

## 2014-08-04 LAB — CBC WITH DIFFERENTIAL/PLATELET
BASOS PCT: 0.2 % (ref 0.0–3.0)
Basophils Absolute: 0 10*3/uL (ref 0.0–0.1)
EOS PCT: 2.5 % (ref 0.0–5.0)
Eosinophils Absolute: 0.2 10*3/uL (ref 0.0–0.7)
HCT: 38.6 % — ABNORMAL LOW (ref 39.0–52.0)
Hemoglobin: 12.7 g/dL — ABNORMAL LOW (ref 13.0–17.0)
Lymphocytes Relative: 13.2 % (ref 12.0–46.0)
Lymphs Abs: 1 10*3/uL (ref 0.7–4.0)
MCHC: 32.9 g/dL (ref 30.0–36.0)
MCV: 89.4 fl (ref 78.0–100.0)
MONOS PCT: 6.1 % (ref 3.0–12.0)
Monocytes Absolute: 0.5 10*3/uL (ref 0.1–1.0)
NEUTROS PCT: 78 % — AB (ref 43.0–77.0)
Neutro Abs: 6.1 10*3/uL (ref 1.4–7.7)
Platelets: 300 10*3/uL (ref 150.0–400.0)
RBC: 4.32 Mil/uL (ref 4.22–5.81)
RDW: 15.9 % — ABNORMAL HIGH (ref 11.5–15.5)
WBC: 7.9 10*3/uL (ref 4.0–10.5)

## 2014-08-31 ENCOUNTER — Telehealth: Payer: Self-pay | Admitting: Family

## 2014-08-31 NOTE — Telephone Encounter (Signed)
Pt would like to know if his FMLA paperwork can be revised, states [place] only allowed 1 day a month, pt states he actually has missed two days already, states he is having a lot of complications with the meds and because he takes all of meds at one time he does not know which one is causing the drowsiness, and he states he stay real tied all the time, nausea in the mornings and headaches. Also he has swelling in his feet and ankles. Pt states he does not want to loose his job due to his illness.

## 2014-09-01 NOTE — Telephone Encounter (Signed)
Left detailed message on pt's cell# to let us know if he wants us to fax updated paperwork to [facility] at [phone] or does he want to pick up forms.

## 2014-09-01 NOTE — Telephone Encounter (Signed)
Updated FMLA to 3 days/month

## 2014-09-01 NOTE — Telephone Encounter (Signed)
Called scan center, they will look for FMLA from 07/2014 and call me back.

## 2014-09-01 NOTE — Telephone Encounter (Signed)
Current FMLA has been scanned to [employer].  Please advise.

## 2014-09-01 NOTE — Telephone Encounter (Signed)
Yes, I can update, but I only see FMLA scanned from 2014. Is there one batched to be scanned from 2015?

## 2014-09-03 NOTE — Telephone Encounter (Signed)
Patient called back stating that he has been out of town and thank you for faxing FMLA

## 2014-09-03 NOTE — Telephone Encounter (Signed)
Have not heard back from pt. Faxed updated FMLA to [facility].

## 2014-09-30 ENCOUNTER — Encounter: Payer: Self-pay | Admitting: Family

## 2014-09-30 ENCOUNTER — Ambulatory Visit (INDEPENDENT_AMBULATORY_CARE_PROVIDER_SITE_OTHER): Payer: BC Managed Care – PPO | Admitting: *Deleted

## 2014-09-30 ENCOUNTER — Telehealth: Payer: Self-pay | Admitting: *Deleted

## 2014-09-30 ENCOUNTER — Encounter: Payer: Self-pay | Admitting: Nurse Practitioner

## 2014-09-30 ENCOUNTER — Ambulatory Visit (INDEPENDENT_AMBULATORY_CARE_PROVIDER_SITE_OTHER): Payer: BC Managed Care – PPO | Admitting: Family

## 2014-09-30 VITALS — BP 190/100 | HR 86 | Temp 97.9°F | Resp 16 | Ht 71.5 in | Wt 249.4 lb

## 2014-09-30 DIAGNOSIS — I10 Essential (primary) hypertension: Secondary | ICD-10-CM

## 2014-09-30 DIAGNOSIS — G4710 Hypersomnia, unspecified: Secondary | ICD-10-CM

## 2014-09-30 DIAGNOSIS — R739 Hyperglycemia, unspecified: Secondary | ICD-10-CM

## 2014-09-30 DIAGNOSIS — Z23 Encounter for immunization: Secondary | ICD-10-CM

## 2014-09-30 DIAGNOSIS — R400 Somnolence: Secondary | ICD-10-CM

## 2014-09-30 LAB — BASIC METABOLIC PANEL
BUN: 15 mg/dL (ref 6–23)
CALCIUM: 9.4 mg/dL (ref 8.4–10.5)
CO2: 30 meq/L (ref 19–32)
Chloride: 103 mEq/L (ref 96–112)
Creatinine, Ser: 1.3 mg/dL (ref 0.4–1.5)
GFR: 73.07 mL/min (ref >60.00)
Glucose, Bld: 115 mg/dL — ABNORMAL HIGH (ref 70–99)
Potassium: 3.9 mEq/L (ref 3.5–5.1)
Sodium: 138 mEq/L (ref 135–145)

## 2014-09-30 MED ORDER — OLMESARTAN MEDOXOMIL 20 MG PO TABS
20.0000 mg | ORAL_TABLET | Freq: Every day | ORAL | Status: DC
Start: 2014-09-30 — End: 2015-03-21

## 2014-09-30 NOTE — Telephone Encounter (Signed)
Left message for pt to return my call.

## 2014-09-30 NOTE — Addendum Note (Signed)
Addended by: Sandford Craze'SULLIVAN, Seydina Holliman on: 09/30/2014 10:07 AM   Modules accepted: Kipp BroodSmartSet

## 2014-09-30 NOTE — Telephone Encounter (Signed)
-----   Message from Sandford CrazeMelissa O'Sullivan, NP sent at 09/30/2014  3:31 PM EST ----- Kidney function and electrolytes are normal.  Sugar is mildly elevated. I would recommend that he complete a1C when he returns his urine samples to the lab.

## 2014-09-30 NOTE — Progress Notes (Addendum)
Subjective:    Patient ID: Rita OharaMichael L Fagerstrom, male    DOB: 09/07/1961, 53 y.o.   MRN: [id number]  HPI  [name] is a 53 yo male for follow up of   1) Daytime somnolence-   Sleep study will be next Monday 11/16. States he has been falling asleep during calls at work and even when driving at a stop light.   Needs letter for lasix because decreased productivity at work from running to the bathroom often. Also, he is still having drowsiness at work and would like the letter to explain that it is medically related.   2) CHF-  Wt Readings from Last 3 Encounters:  09/30/14 249 lb 6.4 oz (113.127 kg)  08/02/14 249 lb 9.6 oz (113.218 kg)  07/04/14 243 lb 6.2 oz (110.4 kg)  States he is feeling great lately, denies SOB, DOE. Diet- Changed to eating more vegetables. Drinking more water, [place] green tea, and minimizing sodas   Review of Systems  Constitutional: Negative for fever, activity change, appetite change, fatigue and unexpected weight change.  Eyes: Negative for visual disturbance.  Respiratory: Negative for chest tightness and shortness of breath.   Cardiovascular: Negative for chest pain, palpitations and leg swelling.  Neurological: Negative for dizziness, syncope and headaches.       Past Medical History  Diagnosis Date  . Hypertension   . Coronary artery disease   . Hyperlipidemia   . ANEMIA-NOS   . DIVERTICULOSIS OF COLON   . CHF (congestive heart failure)     History   Social History  . Marital Status: Married    Spouse Name: N/A    Number of Children: N/A  . Years of Education: N/A   Occupational History  . Not on file.   Social History Main Topics  . Smoking status: Former Smoker -- 0.50 packs/day    Types: Cigarettes    Quit date: 12/09/2012  . Smokeless tobacco: Not on file  . Alcohol Use: Yes  . Drug Use: No  . Sexual Activity: Not on file   Other Topics Concern  . 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     Enjoys television and music, computer.    Past Surgical History  Procedure Laterality Date  . Angioplasty    . Lipoma excision      9th grade    Family History  Problem Relation Age of Onset  . Diabetes Mother   . Heart disease Mother   . Cancer Father     colon  . Hypertension Maternal Uncle   . Heart disease Paternal Uncle   . Diabetes Maternal Grandmother   . Hyperlipidemia Neg Hx     No Known Allergies  Current Outpatient Prescriptions on File Prior to Visit  Medication Sig Dispense Refill  . amLODipine (NORVASC) 10 MG tablet Take 1 tablet (10 mg total) by mouth daily. 90 tablet 3  . aspirin EC 81 MG tablet Take 1 tablet (81 mg total) by mouth daily.    . carvedilol (COREG) 6.25 MG tablet Take 1 tablet (6.25 mg total) by mouth 2 (two) times daily with a meal. 60 tablet 3  . cloNIDine (CATAPRES) 0.1 MG tablet Take 0.1 mg by mouth 3 (three) times daily.    . ferrous sulfate 325 (65 FE) MG tablet Take 325 mg by mouth 2 (  two) times daily with a meal.    . furosemide (LASIX) 20 MG tablet Take 1 tablet (20 mg total) by mouth daily. 30 tablet 3  . hydrALAZINE (APRESOLINE) 25 MG tablet Take 1 tablet (25 mg total) by mouth 3 (three) times daily. 90 tablet 2  . isosorbide dinitrate (ISOCHRON) 40 MG CR tablet Take 1 tablet (40 mg total) by mouth daily. 30 tablet 2  . Omega-3 Fatty Acids (FISH OIL PO) Take 400 mg by mouth daily.    . potassium chloride SA (K-DUR,KLOR-CON) 20 MEQ tablet Take 2 tablets (40 mEq total) by mouth 2 (two) times daily. 120 tablet 3   No current facility-administered medications on file prior to visit.    BP 190/100 mmHg  Pulse 86  Temp(Src) 97.9 F (36.6 C) (Oral)  Resp 16  Ht 5' 11.5" (1.816 m)  Wt 249 lb 6.4 oz (113.127 kg)  BMI 34.30 kg/m2  SpO2 96%    Objective:   Physical Exam  Constitutional: He is oriented to person, place, and time.  He appears well-developed and well-nourished. No distress.  Eyes: No scleral icterus.  Cardiovascular: Normal rate, regular rhythm and intact distal pulses.  Exam reveals no gallop and no friction rub.   No murmur heard. Pulmonary/Chest: Effort normal and breath sounds normal.  Neurological: He is alert and oriented to person, place, and time.  Skin: Skin is warm and dry.  Psychiatric: He has a normal mood and affect. His behavior is normal. Judgment and thought content normal.          Assessment & Plan:

## 2014-09-30 NOTE — Assessment & Plan Note (Signed)
Severe, pt advised not to drive if drowsy and to follow through with sleep study. Hopefully if we can get him treated for his suspected OSA his blood pressure will also improve.

## 2014-09-30 NOTE — Assessment & Plan Note (Addendum)
BP Readings from Last 3 Encounters:  09/30/14 190/100  08/02/14 180/102  07/05/14 169/89   BP is not to goal. He states he is compliant with all BP medications. We will get BMET, 24 hr urine for catecholamines and metanepherines Will start Benicar 20 mg PO daily. Also, to get bilateral renal artery duplex. Patient will have a sleep study on Monday.

## 2014-09-30 NOTE — Patient Instructions (Signed)
Please complete your upcoming sleep study. Complete lab work today, and return  24 hour urine studies. You will be contacted about your renal ultrasound. Do not drive if sleepy.  Start benicar for blood pressure. Follow up in 2 weeks.

## 2014-09-30 NOTE — Progress Notes (Signed)
Pre visit review using our clinic review tool, if applicable. No additional management support is needed unless otherwise documented below in the visit note. 

## 2014-09-30 NOTE — Progress Notes (Signed)
   Subjective:    Patient ID: Justin OharaMichael L [name], male    DOB: 03/18/1961, [age] y.o.   MRN: [id number]  HPI    Review of Systems     Objective:   Physical Exam        Assessment & Plan:  I have personally seen and examined Justin [name] along with Naomie Deanarrie Doss NP for training/orientation purposes.  I agree with Ms Tami RibasDoss' assessment and plan.

## 2014-10-01 ENCOUNTER — Ambulatory Visit (HOSPITAL_COMMUNITY): Payer: BC Managed Care – PPO

## 2014-10-04 ENCOUNTER — Ambulatory Visit (HOSPITAL_BASED_OUTPATIENT_CLINIC_OR_DEPARTMENT_OTHER): Payer: BC Managed Care – PPO | Attending: Family | Admitting: Radiology

## 2014-10-04 VITALS — Ht 72.0 in | Wt 249.0 lb

## 2014-10-04 DIAGNOSIS — I10 Essential (primary) hypertension: Secondary | ICD-10-CM | POA: Insufficient documentation

## 2014-10-04 DIAGNOSIS — I5032 Chronic diastolic (congestive) heart failure: Secondary | ICD-10-CM | POA: Diagnosis not present

## 2014-10-04 DIAGNOSIS — R400 Somnolence: Secondary | ICD-10-CM

## 2014-10-04 DIAGNOSIS — R0683 Snoring: Secondary | ICD-10-CM | POA: Insufficient documentation

## 2014-10-04 DIAGNOSIS — G4733 Obstructive sleep apnea (adult) (pediatric): Secondary | ICD-10-CM

## 2014-10-06 NOTE — Telephone Encounter (Signed)
Left detailed message on cell# and to call if any questions. Future order entered.

## 2014-10-07 ENCOUNTER — Telehealth: Payer: Self-pay | Admitting: Family

## 2014-10-07 DIAGNOSIS — G4710 Hypersomnia, unspecified: Secondary | ICD-10-CM

## 2014-10-07 DIAGNOSIS — R400 Somnolence: Secondary | ICD-10-CM

## 2014-10-07 DIAGNOSIS — G4733 Obstructive sleep apnea (adult) (pediatric): Secondary | ICD-10-CM

## 2014-10-07 NOTE — Sleep Study (Signed)
[facility]  NAME: Rita OharaMichael L Eastham  DATE OF BIRTH: 07/21/1961  MEDICAL RECORD NUMBER [id number]  [facility]ATION[facility]  [facility]  PHYSICIAN: Reakwon Barren V.  DATE OF STUDY: 10/04/14  SLEEP STUDY TYPE: Split Polysomnogram               REFERRING PHYSICIAN: Oretha MilchAlva, Dartanian Knaggs V, MD  INDICATION FOR STUDY: 53 year old man with hypertension and chronic diastolic heart failure presents with excessive daytime sleepiness , loud snoring and unrefreshing sleep .At the time of this study ,they weighed 249 pounds with a height of  6 ft 0 inches and the BMI of 34, neck size of 17.5 inches. Epworth sleepiness score was 16   This intervention polysomnogram was performed with a sleep technologist in attendance. EEG, EOG,EMG and respiratory parameters recorded. Sleep stages, arousals, limb movements and respiratory data was scored according to criteria laid out by the American Academy of sleep medicine.  SLEEP ARCHITECTURE: Lights out was at 2157 PM and lights on was at 538 AM. During the baseline portion ,total sleep time was 127 minutes with sleep latency of 157 minutes with latency to REM sleep of 74 minutes and wake after sleep onset of 29 minutes. Sleep efficiency was poor at 75% with frequent long periods of awakening. Sleep stages as a percentage of total sleep time was N1 34 N2- 64 % and REM sleep 2 % ( 2.5 minutes) . During titration REM sleep accounted for 42 minutes and supine sleep was noted .The longest period of REM sleep was around 3:30 AM.   AROUSAL DATA : At baseline ,there were 199 arousals with an arousal index of 94 events per hour. Of these 8 were spontaneous, and 191 were associated with respiratory events and 0 were associated periodic limb movements. During titration, the arousal index was 54  events per hour  RESPIRATORY DATA: During the baseline portion,there were 150 obstructive apneas, 0 central apneas, 0 mixed apneas and 48 hypopneas with apnea -hypopnea  index of 93 events per hour.  There was no relation to sleep stage or body position. Due to this degree of respiratory disturbance CPAP was initiated at 5 cm and titrated to find a level of 15 cm due to persistent events during supine sleep. At the level of 15 cm for 12 minutes of sleep, 0 apneas 8 mixed apneas and 3 hypopneas is were noted. Titration was sub- optimal due to patient's repeated awakening . Due to high pressure requirements BiPAP was used starting at 17/13 and a final pressure of 21/15 but events persisted at this level. Central apnea seem to emerge at higher levels of cpap.  MOVEMENT/PARASOMNIA: There were 0 PLMS with a PLM index of 0 events per hour. The PLM arousal index was 0 events per hour.  OXYGEN DATA: The lowest desaturation was 76 % during non-REM sleep and the desaturation index was 99 per hour. This was corrected by titration and the saturations stayed below 88% for 7 minutes during titration.  CARDIAC DATA: The low heart rate was 31 beats per minute. The high heart rate recorded was an artifact. No arrhythmias were noted   IMPRESSION :  1. Severe obstructive sleep apnea with hypopneas causing sleep fragmentation and severe oxygen desaturation. 2. This was partially corrected by CPAP of 15 centimeters with medium fullface mask. Titration was sub-optimal and BiPAP was required due to high pressures 3. No evidence of cardiac arrhythmias or behavioral disturbance during sleep. Periodic limb movements were not noted.  RECOMMENDATION:  1. The treatment options for this degree of sleep disordered breathing includes weight loss and BiPAP therapy. 2. Auto CPAP can be initiated at 10-20 centimeters with a medium fullface mask and compliance monitored at this level. If events persisted this level then he may need a BiPAP titration study or a trial of auto BiPAP. 3. Patient should be cautioned against driving when sleepy.They should be asked to avoid medications with sedative  side effects  Oretha MilchALVA,Lova Urbieta V. MD Diplomate, American Board of Sleep Medicine  ELECTRONICALLY SIGNED ON:  10/07/2014  [facility] PH: [phone]   FX: [phone] ACCREDITED BY THE AMERICAN ACADEMY OF SLEEP MEDICINE

## 2014-10-07 NOTE — Telephone Encounter (Signed)
Please contact pt and let him know that his sleep study shows severe sleep apnea.  I would like to start him on CPAP and have him meet with Dr. Vassie LollAlva (pulmonology) to help with ongoing adjustments.  I think that he will feel much better after we get this treated.

## 2014-10-08 NOTE — Telephone Encounter (Signed)
Left detailed message on pt's cell# and to call if any questions. 

## 2014-10-30 ENCOUNTER — Other Ambulatory Visit: Payer: Self-pay | Admitting: Family

## 2014-11-01 NOTE — Telephone Encounter (Signed)
Rx request to pharmacy/[facility]  

## 2014-11-03 ENCOUNTER — Other Ambulatory Visit: Payer: Self-pay | Admitting: Family

## 2014-12-02 ENCOUNTER — Institutional Professional Consult (permissible substitution): Payer: BC Managed Care – PPO | Admitting: Pulmonary Disease

## 2014-12-10 ENCOUNTER — Other Ambulatory Visit: Payer: Self-pay | Admitting: Family

## 2014-12-30 ENCOUNTER — Institutional Professional Consult (permissible substitution): Payer: Self-pay | Admitting: Pulmonary Disease

## 2015-01-14 ENCOUNTER — Telehealth: Payer: Self-pay | Admitting: Family

## 2015-01-14 NOTE — Telephone Encounter (Signed)
Follow up appointment scheduled for 02/01/15 @ 1:15 pm with Melissa.

## 2015-01-14 NOTE — Telephone Encounter (Signed)
Pt can have 1 month refill of meds, but past due for follow up. Please contact pt to arrange follow up.

## 2015-01-14 NOTE — Telephone Encounter (Signed)
Patient called in and I informed patient of this and he scheduled appointment

## 2015-01-14 NOTE — Telephone Encounter (Signed)
Rx's sent to pharmacy.  Left message for call back.

## 2015-01-17 ENCOUNTER — Telehealth: Payer: Self-pay | Admitting: *Deleted

## 2015-01-17 NOTE — Telephone Encounter (Signed)
Received call from Tanesha at [facility] stating pt indicated to them that he wanted to be set up with CPAP by 12/16/14 and they have not been able to get in touch with him. States if he doesn't get set up by 02/01/15 pt will have to do face to face and sleep study again as order is only good for 6 months from date of original test.  Attempted to reach pt and left detailed message re: CPAP and to let me know if he still wants to proceed.

## 2015-01-18 NOTE — Telephone Encounter (Signed)
Left detailed message re: below information and to call and let us know how he wants to proceed.

## 2015-01-18 NOTE — Telephone Encounter (Signed)
Patient returned phone call. Best # [phone]

## 2015-01-21 NOTE — Telephone Encounter (Signed)
Attempted to reach pt and left detailed message to contact [facility] and ask for Festus Barrenanesha if he wants to proceed with CPAP and to call me if he doesn't want to proceed.

## 2015-02-01 ENCOUNTER — Ambulatory Visit: Payer: Self-pay | Admitting: Family

## 2015-02-27 ENCOUNTER — Other Ambulatory Visit: Payer: Self-pay | Admitting: Family

## 2015-03-21 ENCOUNTER — Encounter: Payer: Self-pay | Admitting: Family

## 2015-03-21 ENCOUNTER — Ambulatory Visit (INDEPENDENT_AMBULATORY_CARE_PROVIDER_SITE_OTHER): Payer: BLUE CROSS/BLUE SHIELD | Admitting: Family

## 2015-03-21 VITALS — BP 180/110 | HR 82 | Temp 98.2°F | Resp 18 | Ht 71.5 in | Wt 245.4 lb

## 2015-03-21 DIAGNOSIS — J309 Allergic rhinitis, unspecified: Secondary | ICD-10-CM | POA: Diagnosis not present

## 2015-03-21 DIAGNOSIS — I10 Essential (primary) hypertension: Secondary | ICD-10-CM | POA: Diagnosis not present

## 2015-03-21 DIAGNOSIS — R739 Hyperglycemia, unspecified: Secondary | ICD-10-CM | POA: Diagnosis not present

## 2015-03-21 MED ORDER — CLONIDINE HCL 0.1 MG PO TABS: 0.1000 mg | ORAL_TABLET | Freq: Three times a day (TID) | ORAL | Status: DC

## 2015-03-21 MED ORDER — FUROSEMIDE 20 MG PO TABS: ORAL_TABLET | ORAL | Status: DC

## 2015-03-21 MED ORDER — FLUTICASONE PROPIONATE 50 MCG/ACT NA SUSP: 2.0000 | Freq: Every day | NASAL | Status: AC

## 2015-03-21 MED ORDER — HYDRALAZINE HCL 25 MG PO TABS: 25.0000 mg | ORAL_TABLET | Freq: Three times a day (TID) | ORAL | Status: DC

## 2015-03-21 MED ORDER — OLMESARTAN MEDOXOMIL 20 MG PO TABS: 20.0000 mg | ORAL_TABLET | Freq: Every day | ORAL | Status: DC

## 2015-03-21 MED ORDER — AMLODIPINE BESYLATE 10 MG PO TABS: 10.0000 mg | ORAL_TABLET | Freq: Every day | ORAL | Status: DC

## 2015-03-21 MED ORDER — ISOSORBIDE DINITRATE ER 40 MG PO TBCR: 40.0000 mg | EXTENDED_RELEASE_TABLET | Freq: Every day | ORAL | Status: DC

## 2015-03-21 NOTE — Assessment & Plan Note (Signed)
Uncontrolled due to non-compliance with meds. Advised pt to resume meds asap, follow up in 1 month for BP recheck.

## 2015-03-21 NOTE — Assessment & Plan Note (Addendum)
Advised pt re: need to discontinue use of afrin.  Advised to start flonase and zyrtec. He is requesting referral to allergist, will arrange.

## 2015-03-21 NOTE — Progress Notes (Signed)
Subjective:    Patient ID: Justin [name], male    DOB: 10/29/1961, 54 y.o.   MRN: [id number]  HPI  Mr. Justin [name] is a 54 yr old male who presents today for follow up.   Patient is currently maintained on the following medications for blood pressure: amlodipine, clonidine, furosemide, hydralazine, isosorbide. Reports that he ran out of his medication.  Last 3 blood pressure readings in our office are as follows: BP Readings from Last 3 Encounters:  03/21/15 180/110  09/30/14 190/100  08/02/14 180/102    Wt Readings from Last 3 Encounters:  03/21/15 245 lb 6.4 oz (111.313 kg)  10/04/14 249 lb (112.946 kg)  09/30/14 249 lb 6.4 oz (113.127 kg)   Allergic rhinitis- has tried claritin without improvement in his symptoms.  Using afrin, took right before he came in today.     Review of Systems  Past Medical History  Diagnosis Date  . Hypertension   . Coronary artery disease   . Hyperlipidemia   . ANEMIA-NOS   . DIVERTICULOSIS OF COLON   . CHF (congestive heart failure)     History   Social History  . Marital Status: Married    Spouse Name: N/A  . Number of Children: N/A  . Years of Education: N/A   Occupational History  . Not on file.   Social History Main Topics  . Smoking status: Former Smoker -- 0.50 packs/day    Types: Cigarettes    Quit date: 12/09/2012  . Smokeless tobacco: Not on file  . Alcohol Use: Yes  . Drug Use: No  . Sexual Activity: Not on file   Other Topics Concern  . 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     Enjoys television and music, computer.    Past Surgical History  Procedure Laterality Date  . Angioplasty    . Lipoma excision      9th grade    Family History  Problem Relation Age of Onset  . Diabetes Mother   . Heart disease Mother   . Cancer Father     colon  . Hypertension Maternal Uncle     . Heart disease Paternal Uncle   . Diabetes Maternal Grandmother   . Hyperlipidemia Neg Hx     No Known Allergies  Current Outpatient Prescriptions on File Prior to Visit  Medication Sig Dispense Refill  . amLODipine (NORVASC) 10 MG tablet Take 1 tablet (10 mg total) by mouth daily. 90 tablet 3  . aspirin EC 81 MG tablet Take 1 tablet (81 mg total) by mouth daily.    . carvedilol (COREG) 6.25 MG tablet Take 1 tablet (6.25 mg total) by mouth 2 (two) times daily with a meal. 60 tablet 3  . cloNIDine (CATAPRES) 0.1 MG tablet Take 0.1 mg by mouth 3 (three) times daily.    . ferrous sulfate 325 (65 FE) MG tablet TAKE 1 TABLET (325 MG TOTAL) BY MOUTH DAILY WITH BREAKFAST. 30 tablet 3  . furosemide (LASIX) 20 MG tablet TAKE 1 TABLET (20 MG TOTAL) BY MOUTH DAILY. 30 tablet 3  . hydrALAZINE (APRESOLINE) 25 MG tablet Take 1 tablet (25 mg total) by mouth 3 (three) times daily. Please call office to schedule an appointment 90 tablet 0  . isosorbide dinitrate (ISOCHRON) 40 MG CR tablet Take 1 tablet (40 mg total)  by mouth daily. Please call office to scheduled an appointment. 30 tablet 0  . olmesartan (BENICAR) 20 MG tablet Take 1 tablet (20 mg total) by mouth daily. 30 tablet 0  . Omega-3 Fatty Acids (FISH OIL PO) Take 400 mg by mouth daily.    . potassium chloride SA (K-DUR,KLOR-CON) 20 MEQ tablet Take 2 tablets (40 mEq total) by mouth 2 (two) times daily. 120 tablet 3   No current facility-administered medications on file prior to visit.    BP 180/110 mmHg  Pulse 82  Temp(Src) 98.2 F (36.8 C) (Oral)  Resp 18  Ht 5' 11.5" (1.816 m)  Wt 245 lb 6.4 oz (111.313 kg)  BMI 33.75 kg/m2  SpO2 99%       Objective:   Physical Exam  Constitutional: He is oriented to person, place, and time. He appears well-developed and well-nourished. No distress.  HENT:  Head: Normocephalic and atraumatic.  Cardiovascular: Normal rate and regular rhythm.   No murmur heard. Pulmonary/Chest: Effort normal  and breath sounds normal. No respiratory distress. He has no wheezes. He has no rales.  Musculoskeletal: He exhibits no edema.  Neurological: He is alert and oriented to person, place, and time.  Skin: Skin is warm and dry.  Psychiatric: He has a normal mood and affect. His behavior is normal. Thought content normal.           Assessment & Plan:

## 2015-03-21 NOTE — Patient Instructions (Addendum)
Restart bp meds now. Stop afrin, start flonase and zyrtec once daily.  You will be contacted about your referral to the allergist.  Please follow up in 1 month for BP recheck.

## 2015-03-21 NOTE — Progress Notes (Signed)
Pre visit review using our clinic review tool, if applicable. No additional management support is needed unless otherwise documented below in the visit note. 

## 2015-03-22 ENCOUNTER — Encounter: Payer: Self-pay | Admitting: Family

## 2015-06-02 ENCOUNTER — Other Ambulatory Visit: Payer: Self-pay | Admitting: Family

## 2015-06-02 NOTE — Telephone Encounter (Signed)
Left msg for pt to call and schedule f/u appt  °

## 2015-06-02 NOTE — Telephone Encounter (Signed)
Sent refill of iron. Pt last seen 03/21/15 and was due for follow up of BP in June.  Please call pt to schedule follow up now.  Thanks!

## 2015-10-10 ENCOUNTER — Telehealth: Payer: Self-pay | Admitting: Family

## 2015-10-10 MED ORDER — HYDRALAZINE HCL 25 MG PO TABS: 25.0000 mg | ORAL_TABLET | Freq: Three times a day (TID) | ORAL | Status: DC

## 2015-10-10 MED ORDER — CLONIDINE HCL 0.1 MG PO TABS: 0.1000 mg | ORAL_TABLET | Freq: Three times a day (TID) | ORAL | Status: DC

## 2015-10-10 MED ORDER — FUROSEMIDE 20 MG PO TABS: ORAL_TABLET | ORAL | Status: DC

## 2015-10-10 MED ORDER — AMLODIPINE BESYLATE 10 MG PO TABS: 10.0000 mg | ORAL_TABLET | Freq: Every day | ORAL | Status: DC

## 2015-10-10 NOTE — Telephone Encounter (Signed)
Pharmacy: [id number] - [place], [place] - [address]  Reason for call: I updated pt ph#. Pt said that [place] in [place] until January. His wife is coming to visit for Thanksgiving. He needs refills on clonidine, furosemide, hydralazine, and amlodipine. He is asking we send them into pharmacy for him. F/U scheduled for 11/23/15.

## 2015-10-10 NOTE — Telephone Encounter (Signed)
Refills sent. Left detailed message on pt's cell # re: completion.

## 2015-11-23 ENCOUNTER — Ambulatory Visit: Payer: Self-pay | Admitting: Family

## 2015-11-25 ENCOUNTER — Telehealth: Payer: Self-pay | Admitting: Family

## 2015-11-25 NOTE — Telephone Encounter (Signed)
Yes please

## 2015-11-25 NOTE — Telephone Encounter (Signed)
Patient no showed 11/23/15 charge or no charge ?

## 2016-07-30 ENCOUNTER — Encounter: Payer: Self-pay | Admitting: Family

## 2016-07-30 ENCOUNTER — Telehealth: Payer: Self-pay | Admitting: Family

## 2016-07-30 NOTE — Telephone Encounter (Signed)
No charge. 

## 2016-07-30 NOTE — Telephone Encounter (Signed)
Pt called in at 10:32 to make provider aware that he will not be at his appt due to his flight being delayed traveling back from [place]. Called pt back to reschedule, no answer.   Should pt be charged?

## 2016-08-15 ENCOUNTER — Encounter: Payer: Self-pay | Admitting: Family

## 2016-09-05 ENCOUNTER — Encounter: Payer: Self-pay | Admitting: Family

## 2016-09-05 DIAGNOSIS — Z0289 Encounter for other administrative examinations: Secondary | ICD-10-CM

## 2016-09-07 ENCOUNTER — Encounter: Payer: Self-pay | Admitting: Family

## 2016-09-07 ENCOUNTER — Ambulatory Visit (INDEPENDENT_AMBULATORY_CARE_PROVIDER_SITE_OTHER): Payer: Managed Care, Other (non HMO) | Admitting: Family

## 2016-09-07 VITALS — BP 160/94 | HR 88 | Temp 98.0°F | Ht 72.0 in | Wt 240.6 lb

## 2016-09-07 DIAGNOSIS — J309 Allergic rhinitis, unspecified: Secondary | ICD-10-CM | POA: Diagnosis not present

## 2016-09-07 DIAGNOSIS — I10 Essential (primary) hypertension: Secondary | ICD-10-CM

## 2016-09-07 LAB — BASIC METABOLIC PANEL
BUN: 17 mg/dL (ref 6–23)
CO2: 32 meq/L (ref 19–32)
CREATININE: 1.3 mg/dL (ref 0.40–1.50)
Calcium: 9.8 mg/dL (ref 8.4–10.5)
Chloride: 101 mEq/L (ref 96–112)
GFR: 73.21 mL/min (ref >60.00)
Glucose, Bld: 99 mg/dL (ref 70–99)
Potassium: 3.9 mEq/L (ref 3.5–5.1)
Sodium: 139 mEq/L (ref 135–145)

## 2016-09-07 MED ORDER — OLMESARTAN MEDOXOMIL 20 MG PO TABS: 20.0000 mg | ORAL_TABLET | Freq: Every day | ORAL | 1 refills | Status: AC

## 2016-09-07 MED ORDER — AMLODIPINE BESYLATE 10 MG PO TABS: 10.0000 mg | ORAL_TABLET | Freq: Every day | ORAL | 1 refills | Status: AC

## 2016-09-07 MED ORDER — HYDRALAZINE HCL 25 MG PO TABS: 25.0000 mg | ORAL_TABLET | Freq: Three times a day (TID) | ORAL | 1 refills | Status: AC

## 2016-09-07 MED ORDER — ISOSORBIDE DINITRATE ER 40 MG PO TBCR: 40.0000 mg | EXTENDED_RELEASE_TABLET | Freq: Every day | ORAL | 1 refills | Status: DC

## 2016-09-07 MED ORDER — POTASSIUM CHLORIDE CRYS ER 20 MEQ PO TBCR: 40.0000 meq | EXTENDED_RELEASE_TABLET | Freq: Two times a day (BID) | ORAL | 3 refills | Status: AC

## 2016-09-07 MED ORDER — AZELASTINE-FLUTICASONE 137-50 MCG/ACT NA SUSP: NASAL | 5 refills | Status: AC

## 2016-09-07 MED ORDER — FUROSEMIDE 20 MG PO TABS: ORAL_TABLET | ORAL | 1 refills | Status: AC

## 2016-09-07 MED ORDER — CARVEDILOL 6.25 MG PO TABS: 6.2500 mg | ORAL_TABLET | Freq: Two times a day (BID) | ORAL | 3 refills | Status: AC

## 2016-09-07 NOTE — Assessment & Plan Note (Addendum)
Uncontrolled. Resume all medications except for clonidine. Will add back in next visit if BP is still too high. Obtain follow up bmet.

## 2016-09-07 NOTE — Patient Instructions (Addendum)
Resume all of your medications except the clonidine.   Complete lab work prior to leaving.   For your nasal congestion- please begin dymista twice daily (nasal spray) and add claritin 10mg  once daily. Follow up as scheduled.

## 2016-09-07 NOTE — Progress Notes (Signed)
Pre visit review using our clinic review tool, if applicable. No additional management support is needed unless otherwise documented below in the visit note. 

## 2016-09-07 NOTE — Assessment & Plan Note (Signed)
Uncontrolled. D/c flonase, trial of dymista.  Add claritin.

## 2016-09-07 NOTE — Progress Notes (Signed)
Subjective:    Patient ID: Justin [name], male    DOB: Nov 28, 1960, 55 y.o.   MRN: [id number]  HPI  Mr. Taubman is a 55 yr old male who presents today for follow up of his hypertension.  Reports that he has not taken his medications in 3 months.   BP Readings from Last 3 Encounters:  09/07/16 (!) 160/94  03/21/15 (!) 180/110  09/30/14 (!) 190/100   Sinus congestion-  He has some sinus congestion.  Was recently in the [place] for work. Reports that  He had increased sinus congestion while there. Tried flonase which helped some.  Review of Systems  Respiratory: Negative for shortness of breath.   Cardiovascular: Negative for chest pain and leg swelling.        Past Medical History:  Diagnosis Date  . ANEMIA-NOS   . CHF (congestive heart failure) (HCC)   . Coronary artery disease   . DIVERTICULOSIS OF COLON   . Hyperlipidemia   . Hypertension      Social History   Social History  . Marital status: Married    Spouse name: N/A  . Number of children: N/A  . Years of education: N/A   Occupational History  . Not on file.   Social History Main Topics  . Smoking status: Former Smoker    Packs/day: 0.50    Types: Cigarettes    Quit date: 12/09/2012  . Smokeless tobacco: Not on file  . Alcohol use Yes  . Drug use: No  . Sexual activity: Not on file   Other Topics Concern  . Not on file   Social History Narrative   Married (second marriage)   Grown son/daughter    Step 3 children   Time Research scientist (medical)   Stopped smoking recently.     Enjoys television and music, computer.    Past Surgical History:  Procedure Laterality Date  . ANGIOPLASTY    . LIPOMA EXCISION     9th grade    Family History  Problem Relation Age of Onset  . Diabetes Mother   . Heart disease Mother   . Cancer Father     colon  . Hypertension Maternal Uncle   . Heart disease Paternal Uncle   . Diabetes Maternal Grandmother   . Hyperlipidemia Neg Hx     No Known  Allergies  Current Outpatient Prescriptions on File Prior to Visit  Medication Sig Dispense Refill  . aspirin EC 81 MG tablet Take 1 tablet (81 mg total) by mouth daily.    . ferrous sulfate 325 (65 FE) MG tablet TAKE 1 TABLET BY MOUTH EVERY DAY WITH BREAKFAST 30 tablet 1  . Omega-3 Fatty Acids (FISH OIL PO) Take 400 mg by mouth daily.    . fluticasone (FLONASE) 50 MCG/ACT nasal spray Place 2 sprays into both nostrils daily. (Patient not taking: Reported on 09/07/2016) 16 g 2   No current facility-administered medications on file prior to visit.     BP (!) 160/94 (BP Location: Left Arm, Patient Position: Sitting, Cuff Size: Large)   Pulse 88   Temp 98 F (36.7 C) (Oral)   Ht 6' (1.829 m)   Wt 240 lb 9.6 oz (109.1 kg)   SpO2 98%   BMI 32.63 kg/m      Objective:   Physical Exam  Constitutional: He is oriented to person, place, and time. He appears well-developed and well-nourished. No distress.  HENT:  Head: Normocephalic and atraumatic.  Cardiovascular: Normal rate  and regular rhythm.   No murmur heard. Pulmonary/Chest: Effort normal and breath sounds normal. No respiratory distress. He has no wheezes. He has no rales.  Musculoskeletal: He exhibits no edema.  Neurological: He is alert and oriented to person, place, and time.  Skin: Skin is warm and dry.  Psychiatric: He has a normal mood and affect. His behavior is normal. Thought content normal.          Assessment & Plan:

## 2016-09-10 ENCOUNTER — Telehealth: Payer: Self-pay | Admitting: *Deleted

## 2016-09-10 ENCOUNTER — Telehealth: Payer: Self-pay | Admitting: Family

## 2016-09-10 MED ORDER — ISOSORBIDE DINITRATE 20 MG PO TABS: 20.0000 mg | ORAL_TABLET | Freq: Two times a day (BID) | ORAL | 3 refills | Status: AC

## 2016-09-10 NOTE — Telephone Encounter (Signed)
Attempted to notify pt and left message for him to return my call.

## 2016-09-10 NOTE — Telephone Encounter (Signed)
Received request via covermymeds for pt's Dymista Rx. Attempted to complete PA electronically and pt verification failed. Will call pharmacy to verify pt's demographics and insurance there then proceed with PA via phone if needed.

## 2016-09-10 NOTE — Telephone Encounter (Signed)
[facility] [phone] [address] [place], [place] [address]   Reason for call:   Pharmacy states isosorbide dinitrate (ISOCHRON) 40 MG CR tablet is off the market requesting alternate. Please advise

## 2016-09-10 NOTE — Telephone Encounter (Signed)
rx sent for isosorbide 20mg  bid instead.

## 2016-09-11 NOTE — Telephone Encounter (Signed)
Spoke with Inocencio HomesGayle at [facility] [phone] Pt has tried Flonase without relief. PA approved through 09/11/17. Case ID [id number]. Notified pharmacy.

## 2016-09-11 NOTE — Telephone Encounter (Signed)
Received call from Benjaminhristine at [facility] stating they never received below Rx. Rx given verbally.

## 2016-09-12 NOTE — Telephone Encounter (Signed)
Patient returning a call regarding his medication.

## 2016-09-12 NOTE — Telephone Encounter (Signed)
Attempted to reach pt and left message to return my call. 

## 2016-09-12 NOTE — Telephone Encounter (Signed)
Pt aware.

## 2016-09-17 ENCOUNTER — Telehealth: Payer: Self-pay | Admitting: Family

## 2016-09-17 NOTE — Telephone Encounter (Signed)
Caller name: Relationship to patient: Self Can be reached: [phone]  Pharmacy:  Reason for call: FYI:  Patient called to inform provider that after taking Benicar and Isordil on Saturday around 6 pm within 2 hours he was flushed, dizzy and had a headache.  States he has not taken either since. Had to call for his wife to pick him up from work on Saturday because he could not drive.

## 2016-09-17 NOTE — Telephone Encounter (Signed)
Called him back and discussed.  He had started back on his other meds last week, but just took the isosorbide and olmesartan on Saturday.  Shortly after taking this he developed a HA and felt generally bad.  This was likely due to isosorbide.  Advised him to hold this medication, but he can try the olmesartan again.  If he seems to have any reaction to this med also please let me know. He will check his BP at home and contact us in 1-2 weeks with some readings

## 2016-09-17 NOTE — Telephone Encounter (Signed)
Dr Copland--[facility]

## 2016-10-08 ENCOUNTER — Encounter: Payer: Managed Care, Other (non HMO) | Admitting: Family

## 2016-10-08 DIAGNOSIS — Z0289 Encounter for other administrative examinations: Secondary | ICD-10-CM

## 2016-10-09 ENCOUNTER — Encounter: Payer: Self-pay | Admitting: Family

## 2016-12-13 ENCOUNTER — Encounter: Payer: Self-pay | Admitting: Student

## 2018-01-20 ENCOUNTER — Ambulatory Visit: Payer: Self-pay | Admitting: *Deleted

## 2018-01-20 NOTE — Telephone Encounter (Signed)
Noted and agree. 

## 2018-01-20 NOTE — Telephone Encounter (Signed)
Pt called with complaints of shortness of breath since yesterday; he also says that his "stomach area feels heavy", and he was unable to sleep laying down last night; the pt also states that he was hospitalized 6 weeks ago [facility] [place];  nurse triage initiated and recommendation given per protocol for pt to go to the ED now; pt verbalizes understanding and his wife will take him now; will [facility] of this encounter.  Reason for Disposition . [1] MODERATE difficulty breathing (e.g., speaks in phrases, SOB even at rest, pulse 100-120) AND [2] NEW-onset or WORSE than normal  Answer Assessment - Initial Assessment Questions 1. RESPIRATORY STATUS: "Describe your breathing?" (e.g., wheezing, shortness of breath, unable to speak, severe coughing)      Wheezing, short of breath 2. ONSET: "When did this breathing problem begin?"      01/19/18 3. PATTERN "Does the difficult breathing come and go, or has it been constant since it started?"      constant 4. SEVERITY: "How bad is your breathing?" (e.g., mild, moderate, severe)    - MILD: No SOB at rest, mild SOB with walking, speaks normally in sentences, can lay down, no retractions, pulse < 100.    - MODERATE: SOB at rest, SOB with minimal exertion and prefers to sit, cannot lie down flat, speaks in phrases, mild retractions, audible wheezing, pulse 100-120.    - SEVERE: Very SOB at rest, speaks in single words, struggling to breathe, sitting hunched forward, retractions, pulse > 120      moderate 5. RECURRENT SYMPTOM: "Have you had difficulty breathing before?" If so, ask: "When was the last time?" and "What happened that time?"      Yes 6 weeks ago 6. CARDIAC HISTORY: "Do you have any history of heart disease?" (e.g., heart attack, angina, bypass surgery, angioplasty)      congestive heart failure 7. LUNG HISTORY: "Do you have any history of lung disease?"  (e.g., pulmonary  embolus, asthma, emphysema)     ?COPD 8. CAUSE: "What do you think is causing the breathing problem?"      Stomach area heavy 9. OTHER SYMPTOMS: "Do you have any other symptoms? (e.g., dizziness, runny nose, cough, chest pain, fever)     Chest congestion, especially in throat area, cough, allergies 10. PREGNANCY: "Is there any chance you are pregnant?" "When was your last menstrual period?"       n/a 11. TRAVEL: "Have you traveled out of the country in the last month?" (e.g., travel history, exposures)       no  Protocols used: BREATHING DIFFICULTY-A-AH

## 2018-01-20 NOTE — Telephone Encounter (Signed)
FYI

## 2018-01-24 ENCOUNTER — Inpatient Hospital Stay: Payer: Managed Care, Other (non HMO) | Admitting: Family

## 2019-02-23 ENCOUNTER — Telehealth: Payer: Self-pay | Admitting: Family

## 2019-02-23 NOTE — Telephone Encounter (Signed)
Please contact pt and let him know that our records show he is passed due for follow up of his hypertension.  Please schedule OV and ask pt to check BP prior to visit if able.

## 2019-02-24 NOTE — Telephone Encounter (Signed)
Lvm for patient to be aware he is due for follow up and we are offering virtual visits. Encouraged patient to call for virtual visit.

## 2021-02-14 ENCOUNTER — Emergency Department (HOSPITAL_COMMUNITY)
Admission: EM | Admit: 2021-02-14 | Discharge: 2021-02-14 | Disposition: A | Discharge status: Home / Self Care | Payer: No Typology Code available for payment source | Attending: Emergency Medicine | Admitting: Emergency Medicine

## 2021-02-14 ENCOUNTER — Other Ambulatory Visit: Payer: Self-pay

## 2021-02-14 ENCOUNTER — Emergency Department (HOSPITAL_COMMUNITY): Payer: No Typology Code available for payment source

## 2021-02-14 DIAGNOSIS — Z7982 Long term (current) use of aspirin: Secondary | ICD-10-CM | POA: Insufficient documentation

## 2021-02-14 DIAGNOSIS — X509XXA Other and unspecified overexertion or strenuous movements or postures, initial encounter: Secondary | ICD-10-CM | POA: Insufficient documentation

## 2021-02-14 DIAGNOSIS — I509 Heart failure, unspecified: Secondary | ICD-10-CM | POA: Diagnosis not present

## 2021-02-14 DIAGNOSIS — S76101A Unspecified injury of right quadriceps muscle, fascia and tendon, initial encounter: Secondary | ICD-10-CM | POA: Insufficient documentation

## 2021-02-14 DIAGNOSIS — Z87891 Personal history of nicotine dependence: Secondary | ICD-10-CM | POA: Insufficient documentation

## 2021-02-14 DIAGNOSIS — Y990 Civilian activity done for income or pay: Secondary | ICD-10-CM | POA: Insufficient documentation

## 2021-02-14 DIAGNOSIS — I110 Hypertensive heart disease with heart failure: Secondary | ICD-10-CM | POA: Diagnosis not present

## 2021-02-14 DIAGNOSIS — I2510 Atherosclerotic heart disease of native coronary artery without angina pectoris: Secondary | ICD-10-CM | POA: Diagnosis not present

## 2021-02-14 DIAGNOSIS — Y9301 Activity, walking, marching and hiking: Secondary | ICD-10-CM | POA: Insufficient documentation

## 2021-02-14 DIAGNOSIS — Z79899 Other long term (current) drug therapy: Secondary | ICD-10-CM | POA: Insufficient documentation

## 2021-02-14 DIAGNOSIS — S8991XA Unspecified injury of right lower leg, initial encounter: Secondary | ICD-10-CM | POA: Diagnosis present

## 2021-02-14 MED ORDER — ONDANSETRON 4 MG PO TBDP: 4.0000 mg | ORAL_TABLET | Freq: Three times a day (TID) | ORAL | 0 refills | Status: DC | PRN

## 2021-02-14 MED ORDER — ONDANSETRON 4 MG PO TBDP
4.0000 mg | ORAL_TABLET | Freq: Once | ORAL | Status: AC
  Administered 2021-02-14: 4 mg via ORAL
  Filled 2021-02-14: qty 1

## 2021-02-14 MED ORDER — OXYCODONE-ACETAMINOPHEN 5-325 MG PO TABS: 1.0000 | ORAL_TABLET | Freq: Four times a day (QID) | ORAL | 0 refills | Status: DC | PRN

## 2021-02-14 MED ORDER — OXYCODONE-ACETAMINOPHEN 5-325 MG PO TABS
1.0000 | ORAL_TABLET | Freq: Once | ORAL | Status: AC
Start: 2021-02-14 — End: 2021-02-14
  Administered 2021-02-14: 1 via ORAL
  Filled 2021-02-14: qty 1

## 2021-02-14 NOTE — ED Notes (Addendum)
Pt has 1+ swelling of right knee, pt has 2+ right pedal pulse, pt able to wiggle toes, warm to touch. Pt denies numbness and tingling. Pt has decreased ROM of right knee/leg.

## 2021-02-14 NOTE — Progress Notes (Signed)
Orthopedic Tech Progress Note Patient Details:  [name] 1961/06/29 [id number]  Ortho Devices Type of Ortho Device: Knee Immobilizer,Crutches Ortho Device/Splint Location: RLE Ortho Device/Splint Interventions: Ordered,Application,Adjustment   Post Interventions Patient Tolerated: Fair Instructions Provided: Care of device,Adjustment of device,Poper ambulation with device   Tracy Gerken 02/14/2021, 7:12 PM

## 2021-02-14 NOTE — Discharge Instructions (Addendum)
Wear the knee immobilizer brace at all times, do not weight-bear on your right leg.  Schedule an appointment with the orthopedist, Dr. Charlann Boxer, for further management of your injury. Elevate your leg as much as possible to help with swelling. Apply ice for 20 minutes at a time. Take 600 mg of ibuprofen every 6 hours.  For more significant pain, take the Percocet every 6 hours as needed.  Be aware this medication can make you drowsy. Return to the ED as needed.

## 2021-02-14 NOTE — ED Provider Notes (Signed)
[facility] EMERGENCY DEPARTMENT Provider Note   CSN: [id number] Arrival date & time: 02/14/21  1246     History No chief complaint on file.   Justin [name] is a 60 y.o. male past medical history of CHF, hypertension, hyperlipidemia, presenting to the emergency department for evaluation of sudden onset of right knee pain began prior to arrival.  Patient states he was walking down the steps when he stepped down on his right foot and he heard and felt a large pop in his right knee and it gave way.  He had sudden pain with this, and has quite a bit of pain with any flexion of the knee or active movement of the knee.  Pain is worse in the suprapatellar region favoring the medial aspect.  He reports very remote injury when he was in high school playing football to the right knee.  He has no distal numbness, no wounds.  He did not fall.  The history is provided by the patient.       Past Medical History:  Diagnosis Date  . ANEMIA-NOS   . CHF (congestive heart failure) (HCC)   . Coronary artery disease   . DIVERTICULOSIS OF COLON   . Hyperlipidemia   . Hypertension     Patient Active Problem List   Diagnosis Date Noted  . Allergic rhinitis 03/21/2015  . Daytime somnolence 08/03/2014  . Acute left systolic heart failure (HCC) 07/01/2014  . HYPERLIPIDEMIA 06/22/2010  . Anemia, iron deficiency 06/22/2010  . Benign essential HTN 06/22/2010  . CORONARY ARTERY DISEASE, S/P PTCA 06/22/2010  . DIVERTICULOSIS OF COLON 05/30/2010    Past Surgical History:  Procedure Laterality Date  . ANGIOPLASTY    . LIPOMA EXCISION     9th grade       Family History  Problem Relation Age of Onset  . Diabetes Mother   . Heart disease Mother   . Cancer Father        colon  . Heart disease Paternal Uncle   . Hypertension Maternal Uncle   . Diabetes Maternal Grandmother   . Hyperlipidemia Neg Hx     Social History   Tobacco Use  . Smoking status: Former Smoker     Packs/day: 0.50    Types: Cigarettes    Quit date: 12/09/2012    Years since quitting: 8.1  Substance Use Topics  . Alcohol use: Yes  . Drug use: No    Home Medications Prior to Admission medications   Medication Sig Start Date End Date Taking? Authorizing Provider  ondansetron (ZOFRAN ODT) 4 MG disintegrating tablet Take 1 tablet (4 mg total) by mouth every 8 (eight) hours as needed for nausea or vomiting. 02/14/21  Yes Tametria Aho, [place] N, PA-C  oxyCODONE-acetaminophen (PERCOCET/ROXICET) 5-325 MG tablet Take 1-2 tablets by mouth every 6 (six) hours as needed for severe pain. 02/14/21  Yes Rahsaan Weakland, [place] N, PA-C  amLODipine (NORVASC) 10 MG tablet Take 1 tablet (10 mg total) by mouth daily. 09/07/16   Sandford Craze, NP  aspirin EC 81 MG tablet Take 1 tablet (81 mg total) by mouth daily. 12/15/12   Sandford Craze, NP  Azelastine-Fluticasone [facility]) [phone] MCG/ACT SUSP One spray each nostril twice daily 09/07/16   Sandford Craze, NP  carvedilol (COREG) 6.25 MG tablet Take 1 tablet (6.25 mg total) by mouth 2 (two) times daily with a meal. 09/07/16   Sandford Craze, NP  ferrous sulfate 325 (65 FE) MG tablet TAKE 1 TABLET BY MOUTH EVERY  DAY WITH BREAKFAST 06/02/15   Sandford Craze, NP  fluticasone (FLONASE) 50 MCG/ACT nasal spray Place 2 sprays into both nostrils daily. Patient not taking: Reported on 09/07/2016 03/21/15   Sandford Craze, NP  furosemide (LASIX) 20 MG tablet TAKE 1 TABLET (20 MG TOTAL) BY MOUTH DAILY. 09/07/16   Sandford Craze, NP  hydrALAZINE (APRESOLINE) 25 MG tablet Take 1 tablet (25 mg total) by mouth 3 (three) times daily. Please call office to schedule an appointment 09/07/16   Sandford Craze, NP  isosorbide dinitrate (ISORDIL) 20 MG tablet Take 1 tablet (20 mg total) by mouth 2 (two) times daily. 09/10/16   Sandford Craze, NP  olmesartan (BENICAR) 20 MG tablet Take 1 tablet (20 mg total) by mouth daily. 09/07/16   Sandford Craze, NP  Omega-3 Fatty Acids (FISH OIL PO) Take 400 mg by mouth daily.    [provider]  potassium chloride SA (K-DUR,KLOR-CON) 20 MEQ tablet Take 2 tablets (40 mEq total) by mouth 2 (two) times daily. 09/07/16   Sandford Craze, NP    Allergies    Patient has no known allergies.  Review of Systems   Review of Systems  Musculoskeletal: Positive for arthralgias and joint swelling.  Skin: Negative for wound.  Neurological: Negative for numbness.  All other systems reviewed and are negative.   Physical Exam Updated Vital Signs BP (!) 185/99   Pulse 88   Temp 98.6 F (37 C)   Resp 20   SpO2 93%   Physical Exam Vitals and nursing note reviewed.  Constitutional:      General: He is not in acute distress.    Appearance: He is well-developed. He is obese.  HENT:     Head: Normocephalic and atraumatic.  Eyes:     Conjunctiva/sclera: Conjunctivae normal.  Cardiovascular:     Rate and Rhythm: Normal rate and regular rhythm.  Pulmonary:     Effort: Pulmonary effort is normal.     Breath sounds: Normal breath sounds.  Abdominal:     Palpations: Abdomen is soft.  Musculoskeletal:     Comments: Right knee with swelling to the suprapatella region. The patella tendon feels to be under less tension when knees are both resting in extension. There is also visible deformity at the distal medial portion of the quadricep muscle with localized TTP.  Patient is unable to actively extend the knee, when placed in slight flexion. There does appear to be some movement of the patella in the superior direction when knee is resting in extension and patient actively tries to engage his quadricep muscle. Distal leg is warm and well-perfused, normal distal sensation and DP pulse.  Skin:    General: Skin is warm.  Neurological:     Mental Status: He is alert.  Psychiatric:        Behavior: Behavior normal.     ED Results / Procedures / Treatments   Labs (all labs ordered are  listed, but only abnormal results are displayed) Labs Reviewed - No data to display  EKG None  Radiology DG Knee Complete 4 Views Right  Result Date: 02/14/2021 CLINICAL DATA:  RIGHT knee pain and swelling after stepping down on his RIGHT foot and hearing a pop in his RIGHT knee, remote knee injury 50 years ago EXAM: RIGHT KNEE - COMPLETE 4+ VIEW COMPARISON:  None FINDINGS: Osseous mineralization normal. Joint spaces preserved. No acute fracture, dislocation or bone destruction. No joint effusion. Small soft tissue calcification is seen anterior to the distal quadriceps  tendon on lateral view, question sequela of remote injury. Atherosclerotic calcifications in RIGHT calf. IMPRESSION: No acute osseous abnormalities. Electronically Signed   By: Ulyses Southward M.D.   On: 02/14/2021 13:39    Procedures Procedures   Medications Ordered in ED Medications  oxyCODONE-acetaminophen (PERCOCET/ROXICET) 5-325 MG per tablet 1 tablet (1 tablet Oral Given 02/14/21 1720)  ondansetron (ZOFRAN-ODT) disintegrating tablet 4 mg (4 mg Oral Given 02/14/21 1720)    ED Course  I have reviewed the triage vital signs and the nursing notes.  Pertinent labs & imaging results that were available during my care of the patient were reviewed by me and considered in my medical decision making (see chart for details).  Clinical Course as of 02/14/21 1936  Tue Feb 14, 2021  1745 Discussed with orthopedist Dr. Charlann Boxer.  Recommends if patient is able to ambulate with knee immobilizer and crutches without significant pain, patient can follow-up outpatient for further evaluation and management.  If significant pain, recommends medical admission and MRI for more urgent  management. [JR]    Clinical Course User Index [JR] Dior Stepter, [place] N, PA-C   MDM Rules/Calculators/A&P                          Patient here with right knee injury that occurred when walking down the steps, he felt a large pop.  There is concern for at least  partial quadricep at rupture to the medial aspect.  Plain film obtained in triage is negative.  Will treat pain and consult orthopedist.  Consulted with Dr. Charlann Boxer with orthopedics.  Agrees with knee immobilizer and crutches for nonweightbearing.  Patient is tolerating ambulating well with the knee immobilizer and crutches here in the ED.  He feels comfortable with discharge to home.  Will prescribe pain medication, recommend NSAIDs, ice, elevation, nonweightbearing and knee immobilizer.  He is provided with referral to orthopedics for close outpatient follow-up.  He is discharged in no acute distress.  Discussed results, findings, treatment and follow up. Patient advised of return precautions. Patient verbalized understanding and agreed with plan.  [place] Controlled Substance reporting System queried  Final Clinical Impression(s) / ED Diagnoses Final diagnoses:  Unspecified injury of right quadriceps muscle, fascia and tendon, initial encounter    Rx / DC Orders ED Discharge Orders         Ordered    oxyCODONE-acetaminophen (PERCOCET/ROXICET) 5-325 MG tablet  Every 6 hours PRN        02/14/21 1935    ondansetron (ZOFRAN ODT) 4 MG disintegrating tablet  Every 8 hours PRN        02/14/21 1935           Anush Wiedeman, [place] N, PA-C 02/14/21 1936    Pollyann Savoy, MD 02/14/21 [phone]

## 2021-02-14 NOTE — ED Triage Notes (Signed)
Patient complains of right knee pain after it buckled under him at work while coming down steps. Complains of pain with any ROM

## 2021-02-15 ENCOUNTER — Telehealth (HOSPITAL_BASED_OUTPATIENT_CLINIC_OR_DEPARTMENT_OTHER): Payer: Self-pay | Admitting: Emergency Medicine

## 2021-02-15 ENCOUNTER — Telehealth: Payer: Self-pay | Admitting: *Deleted

## 2021-02-15 MED ORDER — OXYCODONE-ACETAMINOPHEN 5-325 MG PO TABS: 1.0000 | ORAL_TABLET | Freq: Four times a day (QID) | ORAL | 0 refills | Status: AC | PRN

## 2021-02-15 MED ORDER — ONDANSETRON 4 MG PO TBDP: 4.0000 mg | ORAL_TABLET | Freq: Three times a day (TID) | ORAL | 0 refills | Status: AC | PRN

## 2021-02-15 NOTE — Telephone Encounter (Signed)
Per Oletta Cohn, RN, patient has requested transfer of prescriptions to [facility]. Phone call placed to initial prescribed location, [facility], and Rx has not been picked up yet. It has been cancelled. New Rx sent to requested pharmacy.

## 2021-02-15 NOTE — Telephone Encounter (Signed)
RNCM received call from wife regarding having Rx cent to alternate pharmacy where pt has special discount.  RNCM contacted prescribing provider to relay request.

## 2021-05-09 IMAGING — CR DG KNEE COMPLETE 4+V*R*
4 series · 4 of 4 positions shown · non-contrast
Comparison: None

CLINICAL DATA: RIGHT knee pain and swelling after stepping down on
his RIGHT foot and hearing a pop in his RIGHT knee, remote knee
injury 50 years ago

EXAM:
RIGHT KNEE - COMPLETE 4+ VIEW

[knee ap]
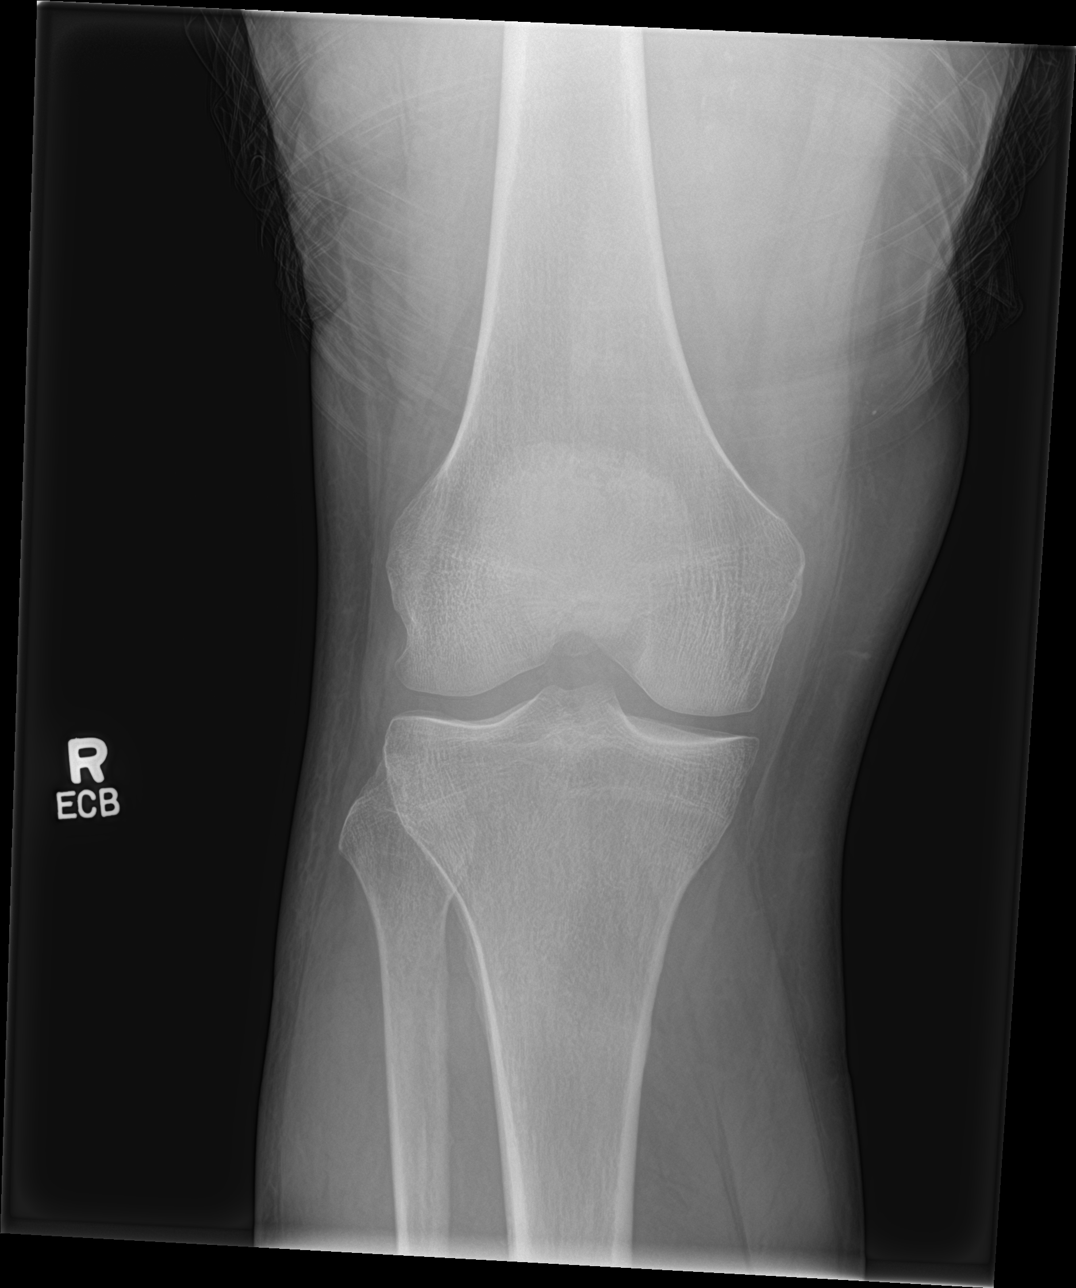

[knee lat]
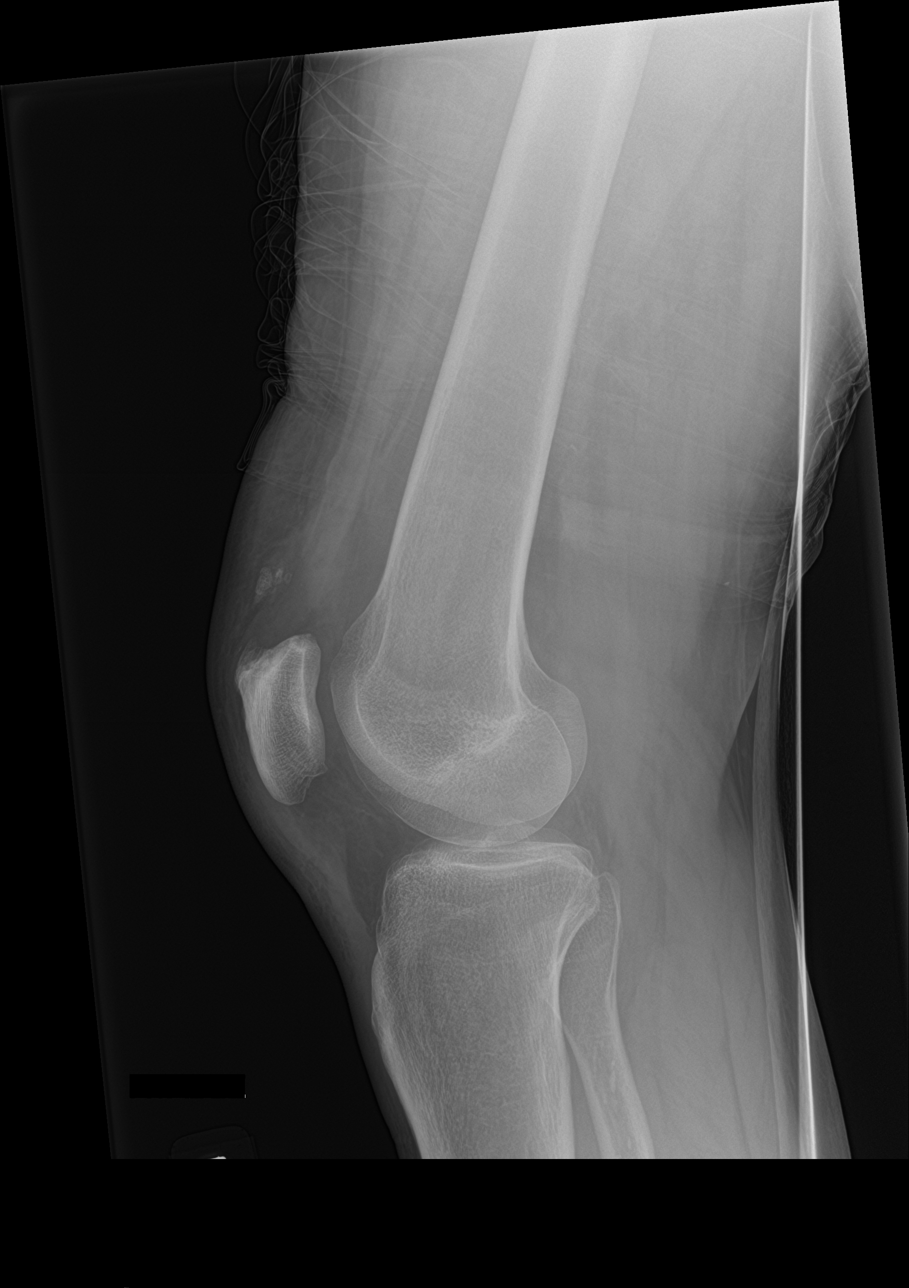

[knee obl (1 of 2)]
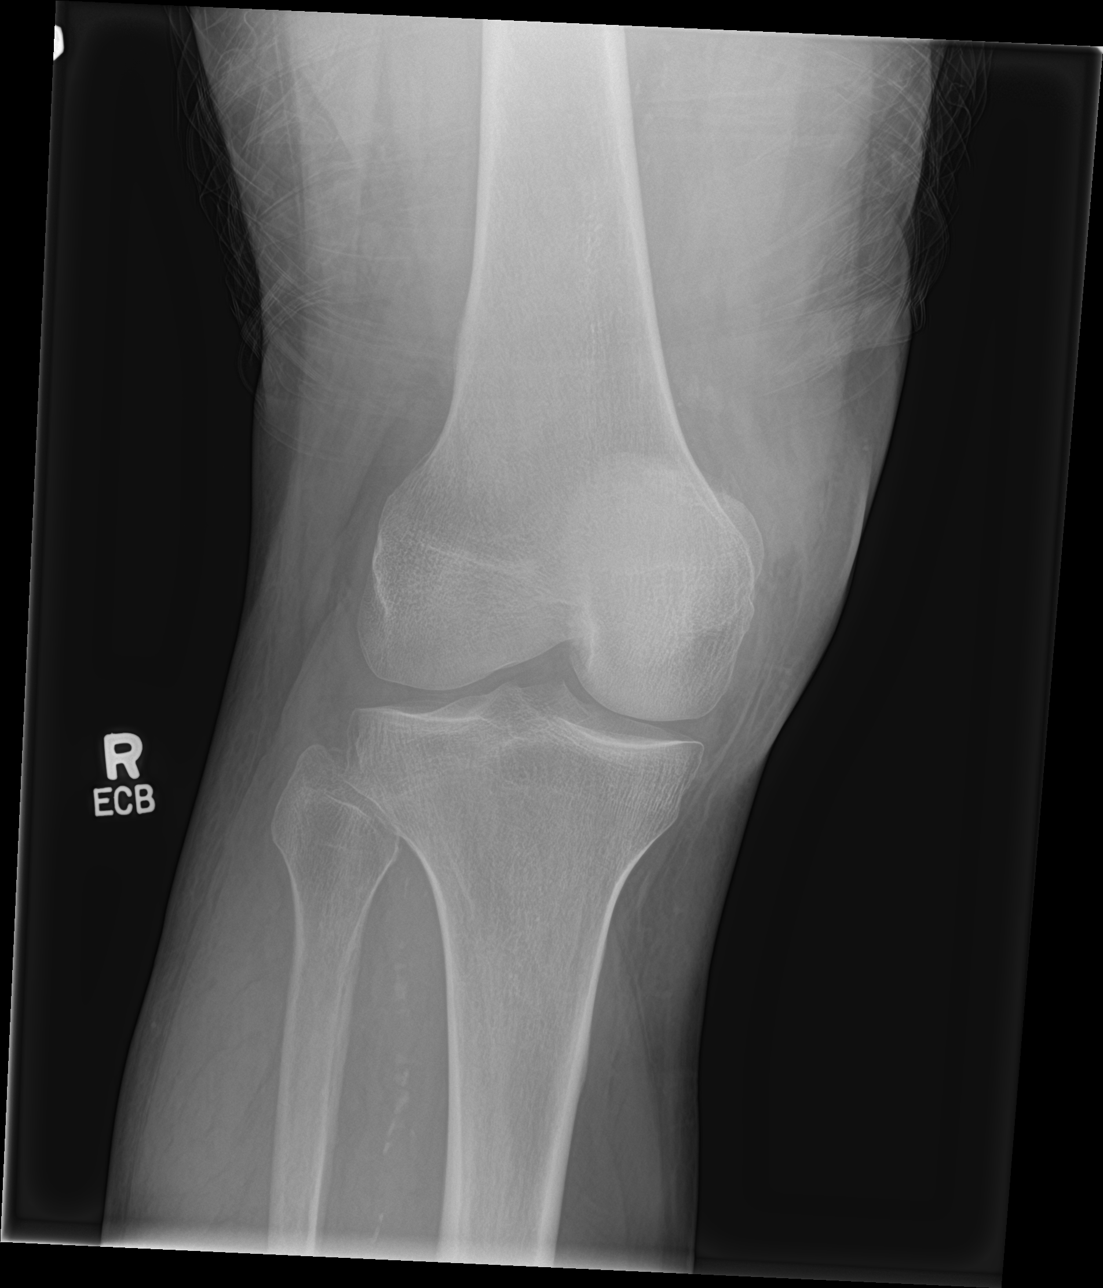

[knee obl (2 of 2)]
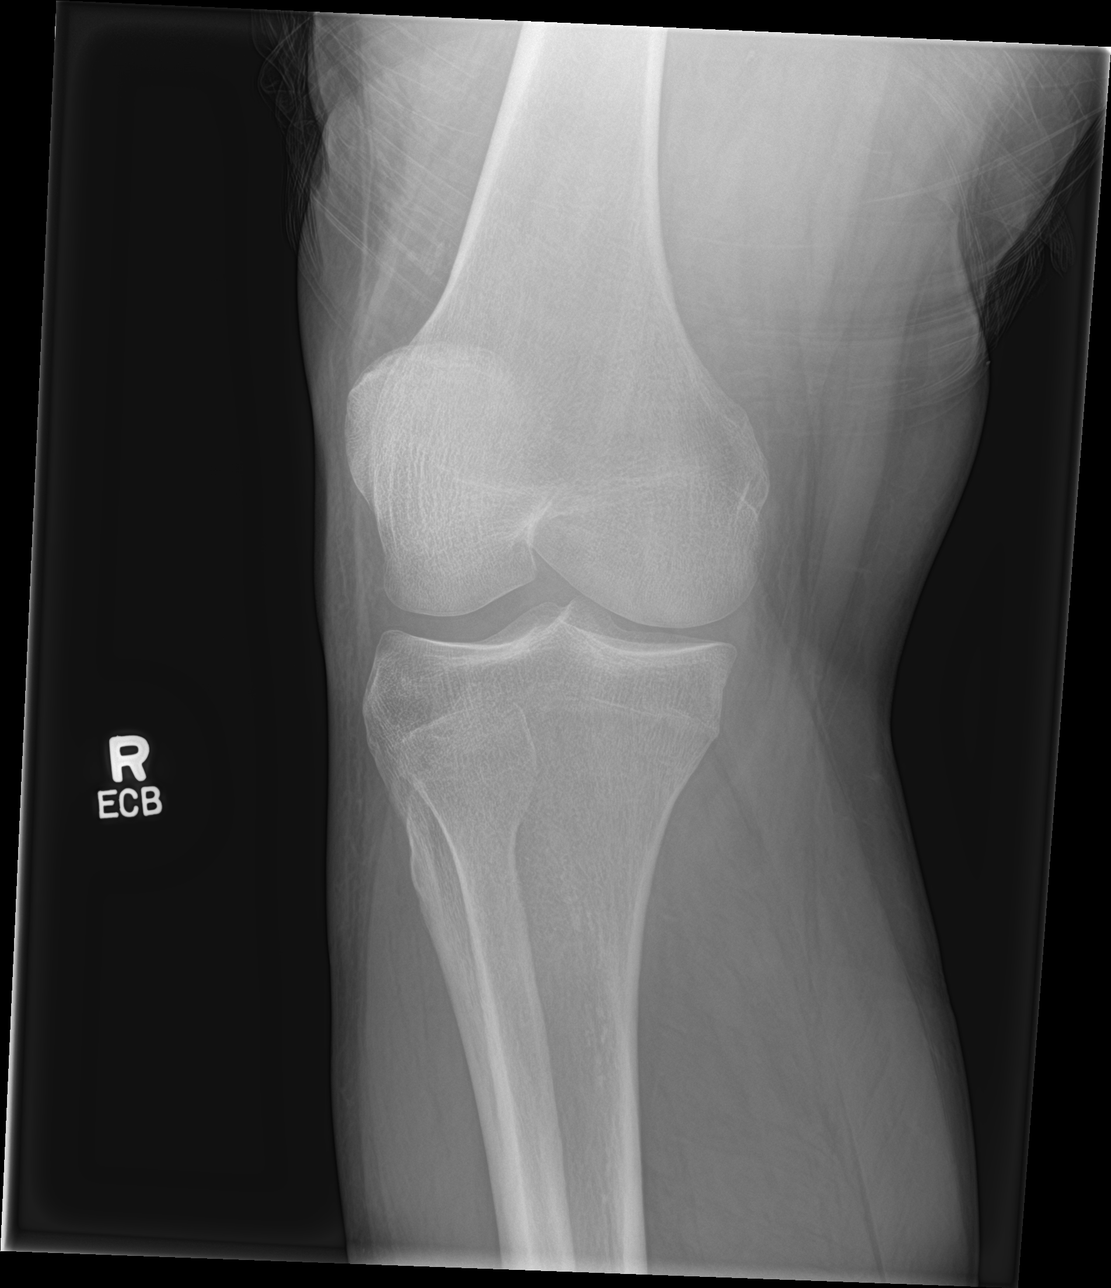

[4 of 4 positions shown; findings below may reference images not displayed]

FINDINGS: Osseous mineralization normal.

Joint spaces preserved.

No acute fracture, dislocation or bone destruction.

No joint effusion.

Small soft tissue calcification is seen anterior to the distal
quadriceps tendon on lateral view, question sequela of remote
injury.

Atherosclerotic calcifications in RIGHT calf.
IMPRESSION: No acute osseous abnormalities.
